# Patient Record
Sex: Male | Born: 1935 | Race: White | Hispanic: No | Marital: Married | Smoking: Former smoker
Health system: Southern US, Community
[De-identification: ages and names within clinical notes are randomized; demographics above are authoritative.]

## PROBLEM LIST (undated history)

## (undated) DIAGNOSIS — Z8711 Personal history of peptic ulcer disease: Secondary | ICD-10-CM

## (undated) DIAGNOSIS — Z8739 Personal history of other diseases of the musculoskeletal system and connective tissue: Secondary | ICD-10-CM

## (undated) DIAGNOSIS — N182 Chronic kidney disease, stage 2 (mild): Secondary | ICD-10-CM

## (undated) DIAGNOSIS — B86 Scabies: Secondary | ICD-10-CM

## (undated) DIAGNOSIS — K5730 Diverticulosis of large intestine without perforation or abscess without bleeding: Secondary | ICD-10-CM

## (undated) DIAGNOSIS — I10 Essential (primary) hypertension: Secondary | ICD-10-CM

## (undated) DIAGNOSIS — Z22322 Carrier or suspected carrier of Methicillin resistant Staphylococcus aureus: Secondary | ICD-10-CM

## (undated) DIAGNOSIS — R609 Edema, unspecified: Principal | ICD-10-CM

## (undated) DIAGNOSIS — E118 Type 2 diabetes mellitus with unspecified complications: Secondary | ICD-10-CM

## (undated) DIAGNOSIS — N400 Enlarged prostate without lower urinary tract symptoms: Secondary | ICD-10-CM

## (undated) DIAGNOSIS — H2102 Hyphema, left eye: Secondary | ICD-10-CM

## (undated) DIAGNOSIS — F0390 Unspecified dementia without behavioral disturbance: Secondary | ICD-10-CM

## (undated) DIAGNOSIS — H409 Unspecified glaucoma: Secondary | ICD-10-CM

## (undated) DIAGNOSIS — H3530 Unspecified macular degeneration: Secondary | ICD-10-CM

## (undated) DIAGNOSIS — M1990 Unspecified osteoarthritis, unspecified site: Secondary | ICD-10-CM

## (undated) DIAGNOSIS — C61 Malignant neoplasm of prostate: Secondary | ICD-10-CM

## (undated) DIAGNOSIS — K449 Diaphragmatic hernia without obstruction or gangrene: Secondary | ICD-10-CM

## (undated) DIAGNOSIS — E785 Hyperlipidemia, unspecified: Secondary | ICD-10-CM

## (undated) DIAGNOSIS — F419 Anxiety disorder, unspecified: Secondary | ICD-10-CM

## (undated) HISTORY — DX: Unspecified dementia without behavioral disturbance: F03.90

## (undated) HISTORY — DX: Chronic kidney disease, stage 2 (mild): N18.2

## (undated) HISTORY — DX: Anxiety disorder, unspecified: F41.9

## (undated) HISTORY — DX: Diaphragmatic hernia without obstruction or gangrene: K44.9

## (undated) HISTORY — DX: Essential (primary) hypertension: I10

## (undated) HISTORY — DX: Unspecified glaucoma: H40.9

## (undated) HISTORY — PX: NASAL SINUS SURGERY: SHX719

## (undated) HISTORY — DX: Malignant neoplasm of prostate: C61

## (undated) HISTORY — DX: Edema, unspecified: R60.9

## (undated) HISTORY — DX: Hyphema, left eye: H21.02

## (undated) HISTORY — DX: Unspecified macular degeneration: H35.30

## (undated) HISTORY — DX: Benign prostatic hyperplasia without lower urinary tract symptoms: N40.0

## (undated) HISTORY — DX: Carrier or suspected carrier of methicillin resistant Staphylococcus aureus: Z22.322

## (undated) HISTORY — DX: Unspecified osteoarthritis, unspecified site: M19.90

## (undated) HISTORY — DX: Hyperlipidemia, unspecified: E78.5

## (undated) HISTORY — DX: Diverticulosis of large intestine without perforation or abscess without bleeding: K57.30

## (undated) HISTORY — PX: OTHER SURGICAL HISTORY: SHX169

## (undated) HISTORY — DX: Personal history of other diseases of the musculoskeletal system and connective tissue: Z87.39

## (undated) HISTORY — DX: Type 2 diabetes mellitus with unspecified complications: E11.8

## (undated) HISTORY — PX: APPENDECTOMY: SHX54

## (undated) HISTORY — PX: TONSILLECTOMY AND ADENOIDECTOMY: SHX28

## (undated) HISTORY — DX: Scabies: B86

## (undated) HISTORY — DX: Personal history of peptic ulcer disease: Z87.11

---

## 2000-12-27 ENCOUNTER — Encounter: Payer: Self-pay | Admitting: Emergency Medicine

## 2000-12-27 ENCOUNTER — Emergency Department (HOSPITAL_COMMUNITY): Admission: EM | Admit: 2000-12-27 | Discharge: 2000-12-27 | Payer: Self-pay | Admitting: Emergency Medicine

## 2003-05-08 HISTORY — PX: CARDIOVASCULAR STRESS TEST: SHX262

## 2003-06-20 ENCOUNTER — Encounter: Admission: RE | Admit: 2003-06-20 | Discharge: 2003-06-20 | Payer: Self-pay | Admitting: Specialist

## 2004-03-22 ENCOUNTER — Ambulatory Visit: Payer: Self-pay | Admitting: Pulmonary Disease

## 2004-05-07 HISTORY — PX: OTHER SURGICAL HISTORY: SHX169

## 2004-12-05 ENCOUNTER — Ambulatory Visit: Admission: RE | Admit: 2004-12-05 | Discharge: 2005-02-01 | Payer: Self-pay | Admitting: Radiation Oncology

## 2004-12-06 ENCOUNTER — Ambulatory Visit (HOSPITAL_COMMUNITY): Admission: RE | Admit: 2004-12-06 | Discharge: 2004-12-06 | Payer: Self-pay | Admitting: Urology

## 2005-02-21 ENCOUNTER — Encounter: Admission: RE | Admit: 2005-02-21 | Discharge: 2005-02-21 | Payer: Self-pay | Admitting: Urology

## 2005-02-28 ENCOUNTER — Ambulatory Visit (HOSPITAL_COMMUNITY): Admission: RE | Admit: 2005-02-28 | Discharge: 2005-02-28 | Payer: Self-pay | Admitting: Urology

## 2005-02-28 ENCOUNTER — Ambulatory Visit: Admission: RE | Admit: 2005-02-28 | Discharge: 2005-04-11 | Payer: Self-pay | Admitting: Radiation Oncology

## 2005-02-28 ENCOUNTER — Ambulatory Visit (HOSPITAL_BASED_OUTPATIENT_CLINIC_OR_DEPARTMENT_OTHER): Admission: RE | Admit: 2005-02-28 | Discharge: 2005-02-28 | Payer: Self-pay | Admitting: Urology

## 2005-05-07 HISTORY — PX: COLONOSCOPY: SHX174

## 2005-06-18 ENCOUNTER — Ambulatory Visit: Payer: Self-pay | Admitting: Pulmonary Disease

## 2005-09-05 ENCOUNTER — Ambulatory Visit: Payer: Self-pay | Admitting: Gastroenterology

## 2005-10-16 ENCOUNTER — Ambulatory Visit: Payer: Self-pay | Admitting: Gastroenterology

## 2006-07-17 ENCOUNTER — Ambulatory Visit: Payer: Self-pay | Admitting: Pulmonary Disease

## 2006-10-21 ENCOUNTER — Ambulatory Visit: Payer: Self-pay | Admitting: Pulmonary Disease

## 2006-10-21 LAB — CONVERTED CEMR LAB
ALT: 10 units/L (ref 0–40)
AST: 15 units/L (ref 0–37)
Albumin: 3.7 g/dL (ref 3.5–5.2)
Alkaline Phosphatase: 57 units/L (ref 39–117)
BUN: 25 mg/dL — ABNORMAL HIGH (ref 6–23)
Basophils Absolute: 0 10*3/uL (ref 0.0–0.1)
Basophils Relative: 0.6 % (ref 0.0–1.0)
Bilirubin, Direct: 0.1 mg/dL (ref 0.0–0.3)
CO2: 31 meq/L (ref 19–32)
Calcium: 9 mg/dL (ref 8.4–10.5)
Chloride: 106 meq/L (ref 96–112)
Cholesterol: 149 mg/dL (ref 0–200)
Creatinine, Ser: 1.2 mg/dL (ref 0.4–1.5)
Eosinophils Absolute: 0.1 10*3/uL (ref 0.0–0.6)
Eosinophils Relative: 2.3 % (ref 0.0–5.0)
GFR calc Af Amer: 76 mL/min
GFR calc non Af Amer: 63 mL/min
Glucose, Bld: 73 mg/dL (ref 70–99)
HCT: 37 % — ABNORMAL LOW (ref 39.0–52.0)
HDL: 43.1 mg/dL (ref >39.0)
Hemoglobin: 13.1 g/dL (ref 13.0–17.0)
Hgb A1c MFr Bld: 6.5 % — ABNORMAL HIGH (ref 4.6–6.0)
LDL Cholesterol: 94 mg/dL (ref 0–99)
Lymphocytes Relative: 28.1 % (ref 12.0–46.0)
MCHC: 35.2 g/dL (ref 30.0–36.0)
MCV: 91.3 fL (ref 78.0–100.0)
Monocytes Absolute: 0.4 10*3/uL (ref 0.2–0.7)
Monocytes Relative: 7.9 % (ref 3.0–11.0)
Neutro Abs: 2.8 10*3/uL (ref 1.4–7.7)
Neutrophils Relative %: 61.1 % (ref 43.0–77.0)
Platelets: 166 10*3/uL (ref 150–400)
Potassium: 4.2 meq/L (ref 3.5–5.1)
RBC: 4.06 M/uL — ABNORMAL LOW (ref 4.22–5.81)
RDW: 12.3 % (ref 11.5–14.6)
Sodium: 142 meq/L (ref 135–145)
TSH: 1.71 microintl units/mL (ref 0.35–5.50)
Total Bilirubin: 0.7 mg/dL (ref 0.3–1.2)
Total CHOL/HDL Ratio: 3.5
Total Protein: 6.8 g/dL (ref 6.0–8.3)
Triglycerides: 59 mg/dL (ref 0–149)
VLDL: 12 mg/dL (ref 0–40)
WBC: 4.6 10*3/uL (ref 4.5–10.5)

## 2007-04-08 ENCOUNTER — Telehealth (INDEPENDENT_AMBULATORY_CARE_PROVIDER_SITE_OTHER): Payer: Self-pay | Admitting: *Deleted

## 2007-04-08 ENCOUNTER — Encounter: Payer: Self-pay | Admitting: Pulmonary Disease

## 2007-04-08 DIAGNOSIS — I10 Essential (primary) hypertension: Secondary | ICD-10-CM | POA: Insufficient documentation

## 2007-07-11 ENCOUNTER — Telehealth (INDEPENDENT_AMBULATORY_CARE_PROVIDER_SITE_OTHER): Payer: Self-pay | Admitting: *Deleted

## 2007-07-23 ENCOUNTER — Telehealth (INDEPENDENT_AMBULATORY_CARE_PROVIDER_SITE_OTHER): Payer: Self-pay | Admitting: *Deleted

## 2007-08-26 ENCOUNTER — Telehealth (INDEPENDENT_AMBULATORY_CARE_PROVIDER_SITE_OTHER): Payer: Self-pay | Admitting: *Deleted

## 2007-09-01 ENCOUNTER — Telehealth (INDEPENDENT_AMBULATORY_CARE_PROVIDER_SITE_OTHER): Payer: Self-pay | Admitting: *Deleted

## 2007-10-23 ENCOUNTER — Ambulatory Visit: Payer: Self-pay | Admitting: Pulmonary Disease

## 2007-10-23 DIAGNOSIS — K449 Diaphragmatic hernia without obstruction or gangrene: Secondary | ICD-10-CM | POA: Insufficient documentation

## 2007-10-23 DIAGNOSIS — E7800 Pure hypercholesterolemia, unspecified: Secondary | ICD-10-CM | POA: Insufficient documentation

## 2007-10-23 DIAGNOSIS — K5730 Diverticulosis of large intestine without perforation or abscess without bleeding: Secondary | ICD-10-CM | POA: Insufficient documentation

## 2007-10-23 DIAGNOSIS — M1990 Unspecified osteoarthritis, unspecified site: Secondary | ICD-10-CM | POA: Insufficient documentation

## 2007-10-23 DIAGNOSIS — M109 Gout, unspecified: Secondary | ICD-10-CM

## 2007-10-23 DIAGNOSIS — C61 Malignant neoplasm of prostate: Secondary | ICD-10-CM

## 2007-10-23 DIAGNOSIS — F411 Generalized anxiety disorder: Secondary | ICD-10-CM | POA: Insufficient documentation

## 2007-10-24 ENCOUNTER — Encounter: Payer: Self-pay | Admitting: Pulmonary Disease

## 2007-10-25 DIAGNOSIS — Z87898 Personal history of other specified conditions: Secondary | ICD-10-CM | POA: Insufficient documentation

## 2007-10-27 LAB — CONVERTED CEMR LAB
ALT: 11 units/L (ref 0–53)
AST: 17 units/L (ref 0–37)
Alkaline Phosphatase: 65 units/L (ref 39–117)
BUN: 19 mg/dL (ref 6–23)
Basophils Absolute: 0 10*3/uL (ref 0.0–0.1)
Basophils Relative: 0.4 % (ref 0.0–1.0)
CO2: 31 meq/L (ref 19–32)
Chloride: 103 meq/L (ref 96–112)
Eosinophils Absolute: 0.1 10*3/uL (ref 0.0–0.7)
Eosinophils Relative: 1.3 % (ref 0.0–5.0)
GFR calc non Af Amer: 63 mL/min
HDL: 40.7 mg/dL (ref >39.0)
LDL Cholesterol: 85 mg/dL (ref 0–99)
Lymphocytes Relative: 19.5 % (ref 12.0–46.0)
MCV: 95.1 fL (ref 78.0–100.0)
Neutrophils Relative %: 73.9 % (ref 43.0–77.0)
Platelets: 163 10*3/uL (ref 150–400)
Potassium: 4.6 meq/L (ref 3.5–5.1)
RBC: 4.1 M/uL — ABNORMAL LOW (ref 4.22–5.81)
TSH: 2.34 microintl units/mL (ref 0.35–5.50)
Total Bilirubin: 0.8 mg/dL (ref 0.3–1.2)
VLDL: 29 mg/dL (ref 0–40)
WBC: 6.8 10*3/uL (ref 4.5–10.5)

## 2008-03-15 ENCOUNTER — Telehealth: Payer: Self-pay | Admitting: Pulmonary Disease

## 2008-04-13 ENCOUNTER — Telehealth (INDEPENDENT_AMBULATORY_CARE_PROVIDER_SITE_OTHER): Payer: Self-pay | Admitting: *Deleted

## 2008-04-22 ENCOUNTER — Encounter: Payer: Self-pay | Admitting: Pulmonary Disease

## 2008-10-05 HISTORY — PX: OTHER SURGICAL HISTORY: SHX169

## 2008-10-14 ENCOUNTER — Telehealth (INDEPENDENT_AMBULATORY_CARE_PROVIDER_SITE_OTHER): Payer: Self-pay | Admitting: *Deleted

## 2008-10-22 ENCOUNTER — Encounter: Payer: Self-pay | Admitting: Pulmonary Disease

## 2008-10-22 ENCOUNTER — Ambulatory Visit: Payer: Self-pay | Admitting: Pulmonary Disease

## 2008-10-22 DIAGNOSIS — J309 Allergic rhinitis, unspecified: Secondary | ICD-10-CM | POA: Insufficient documentation

## 2008-10-22 DIAGNOSIS — H547 Unspecified visual loss: Secondary | ICD-10-CM

## 2008-10-22 LAB — CONVERTED CEMR LAB
AST: 21 units/L (ref 0–37)
BUN: 22 mg/dL (ref 6–23)
Basophils Absolute: 0.1 10*3/uL (ref 0.0–0.1)
Bilirubin, Direct: 0.1 mg/dL (ref 0.0–0.3)
Calcium: 9.4 mg/dL (ref 8.4–10.5)
Cholesterol: 162 mg/dL (ref 0–200)
Creatinine, Ser: 1.2 mg/dL (ref 0.4–1.5)
GFR calc non Af Amer: 62.66 mL/min (ref >60)
Glucose, Bld: 66 mg/dL — ABNORMAL LOW (ref 70–99)
HCT: 40.3 % (ref 39.0–52.0)
HDL: 41.8 mg/dL (ref >39.00)
LDL Cholesterol: 98 mg/dL (ref 0–99)
Lymphocytes Relative: 19.9 % (ref 12.0–46.0)
Lymphs Abs: 1.3 10*3/uL (ref 0.7–4.0)
Monocytes Relative: 6.4 % (ref 3.0–12.0)
Neutrophils Relative %: 70.6 % (ref 43.0–77.0)
Platelets: 191 10*3/uL (ref 150.0–400.0)
RDW: 12.5 % (ref 11.5–14.6)
TSH: 1.36 microintl units/mL (ref 0.35–5.50)
Total Bilirubin: 1 mg/dL (ref 0.3–1.2)
VLDL: 22.6 mg/dL (ref 0.0–40.0)

## 2008-10-28 ENCOUNTER — Telehealth (INDEPENDENT_AMBULATORY_CARE_PROVIDER_SITE_OTHER): Payer: Self-pay | Admitting: *Deleted

## 2008-11-12 ENCOUNTER — Telehealth (INDEPENDENT_AMBULATORY_CARE_PROVIDER_SITE_OTHER): Payer: Self-pay | Admitting: *Deleted

## 2008-11-22 LAB — CONVERTED CEMR LAB: Vit D, 25-Hydroxy: 38 ng/mL (ref 30–89)

## 2009-01-23 ENCOUNTER — Encounter: Payer: Self-pay | Admitting: Pulmonary Disease

## 2009-05-07 HISTORY — PX: SKIN CANCER EXCISION: SHX779

## 2009-05-10 ENCOUNTER — Encounter: Payer: Self-pay | Admitting: Pulmonary Disease

## 2009-06-30 ENCOUNTER — Telehealth: Payer: Self-pay | Admitting: Pulmonary Disease

## 2009-10-24 ENCOUNTER — Telehealth (INDEPENDENT_AMBULATORY_CARE_PROVIDER_SITE_OTHER): Payer: Self-pay | Admitting: *Deleted

## 2009-10-27 ENCOUNTER — Ambulatory Visit: Payer: Self-pay | Admitting: Pulmonary Disease

## 2009-11-04 LAB — CONVERTED CEMR LAB
ALT: 10 units/L (ref 0–53)
AST: 16 units/L (ref 0–37)
Albumin: 4 g/dL (ref 3.5–5.2)
CO2: 31 meq/L (ref 19–32)
Calcium: 9 mg/dL (ref 8.4–10.5)
Eosinophils Relative: 3.9 % (ref 0.0–5.0)
GFR calc non Af Amer: 91.78 mL/min (ref >60)
HDL: 39.6 mg/dL (ref >39.00)
Hgb A1c MFr Bld: 7.6 % — ABNORMAL HIGH (ref 4.6–6.5)
Monocytes Relative: 9 % (ref 3.0–12.0)
Neutrophils Relative %: 59 % (ref 43.0–77.0)
Platelets: 189 10*3/uL (ref 150.0–400.0)
Sodium: 138 meq/L (ref 135–145)
TSH: 2.61 microintl units/mL (ref 0.35–5.50)
Total CHOL/HDL Ratio: 4
Triglycerides: 162 mg/dL — ABNORMAL HIGH (ref 0.0–149.0)
Uric Acid, Serum: 4.7 mg/dL (ref 4.0–7.8)
WBC: 5.8 10*3/uL (ref 4.5–10.5)

## 2010-02-24 ENCOUNTER — Encounter: Payer: Self-pay | Admitting: Pulmonary Disease

## 2010-02-28 ENCOUNTER — Encounter: Payer: Self-pay | Admitting: Pulmonary Disease

## 2010-05-05 ENCOUNTER — Telehealth (INDEPENDENT_AMBULATORY_CARE_PROVIDER_SITE_OTHER): Payer: Self-pay | Admitting: *Deleted

## 2010-06-06 NOTE — Assessment & Plan Note (Signed)
Summary: cpx/fasting/apc   CC:  Yearly ROV & review of mult medical problems....  History of Present Illness: 77 y/o WM here for a yearly check up and refill of meds...    ~  [place]  [place], [place] the [place] area soon... he noted decr vision in his left eye recently- [facility] by [facility]- sent to DrEpes for cataract surg done yest 10/21/08... notes persistant decr vision in left eye and due for f/u w/ poss eval by DrMatthews to check retina...   ~  October 27, 2009:  he has had a good year x c/o vision (see below)... he exercises doing yard work & denies CP, palpit, ch in SOB, edema, etc... BP controlled on meds;  Chol OK on the Simva20;  last yr DM med changed to plain Metformin but A1c is 7.6 now & he needs to incr to Bid...   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  ALLERGIC RHINITIS (ICD-477.9) - takes CLARITIN daily & c/o runny nose...  HYPERTENSION (ICD-401.9) - on ASA 81mg /d, DIOVAN 80mg /d & LOZOL 2.5mg /d... BP today = 110/62, and he reports BP's all  ~120/80 at home... feeling well and tolerates meds well... denies HA, fatigue, visual changes, CP, palipit, dizziness, syncope, dyspnea, edema, etc... he mows regularly and walks...   ~  NuclearStressTest 3/05 showed scarring at the base, no ischemia, EF=60%...  ~  6/09: BP was sl low & we stopped Lozol, but he had to restart due to BP elevation off this med.  ~  6/10: CXR- clear; Labs- all WNL.Marland Kitchen. continue same Rx & monitor at home.  HYPERCHOLESTEROLEMIA (ICD-272.0) - on SIMVASTATIN 20mg /d...  ~  FLP 6/08 showed TChol 149, TG 59, HDL 43, LDL 94  ~  FLP 6/09 showed TChol 155, TG 145, HDL 41, LDL 85  ~  FLP 6/10 showed TChol 162, TG 113, HDL 42, LDL 98  ~  FLP 6/11 showed TChol 146, TG 162, HDL 40,  LDL 74  DM (ICD-250.00) - prev on Glucovance 2.5-500 one tab in AM & switched to Metformin 500Qam in 2010... weight is maintained in the 160# range on diet... BS at home are "all good" per pt although he didn't bring list or recall the numbers...  ~  labs 6/08 showed BS= 73, HgA1c= 6.5  ~  labs 6/09 showed BS= 90, A1c= 6.4  ~  labs 6/10 showed BS= 66, A1c= 6.3.Marland KitchenMarland Kitchen rec change to Metformin 500/d alone.  ~  labs 6/11 showed BS= 137, A1c= 7.6.Marland KitchenMarland Kitchen rec to incr the METFORMIN 500mg Bid.  HIATAL HERNIA (ICD-553.3) - off Nexium since he stopped the Indocin Rx... EGD was performed in 1997 showing mild duodenitis only... he has a hx of treated HPylori in 1990 and recurrent PUD in the past...  DIVERTICULOSIS OF COLON (ICD-562.10) - last colonoscopy 6/07 by DrPatterson showed divertics only... prev colon was in 2000 showing divertics, int hems, no polyps...  Hx of CARCINOMA, PROSTATE (ICD-185) - eval and Rx by DrGrapey in 2006 w/ radium seed implantation by DrMurray... BENIGN PROSTATIC HYPERTROPHY, HX OF (ICD-V13.8) - hx BPH w/ BNC per DrGrapey... not on meds  ~  labs 6/09 showed PSA= 0.32  ~  labs 6/10: PSA not done- followed by DrGrapey yearly.  ~  labs 6/11 showed PSA = 0.07  DEGENERATIVE JOINT DISEASE (ICD-715.90) - hx prev left shoulder surg & c/o left shoulder pain after pressure washing... Hx of GOUT (ICD-274.9) - on ALLOPURINOL 300mg /d & off INDOCIN now...  ~  labs 6/09 showed Uric= 3.6  ~  labs 6/10 showed Uric= 4.5  ~  labs 6/11 showed Uric = 4.7  ANXIETY (ICD-300.00) - uses ALPRAZOLAM 0.5mg - 1/2 to 1 tab Tid Prn...  GENERAL HEALTH:  he is an ex-smoker having quit over 46yrs ago...    ***he had Pneumovax in 2001, & Tetanus shot in 2002***   Allergies: 1)  ! Atenolol (Atenolol) 2)  Codeine Phosphate (Codeine Phosphate)  Comments:  Nurse/Medical Assistant: The patient's medications and allergies were reviewed with the patient and were updated in the Medication and Allergy Lists.  Past  History:  Past Medical History: UNSPECIFIED VISUAL LOSS (ICD-369.9) ALLERGIC RHINITIS (ICD-477.9) HYPERTENSION (ICD-401.9) HYPERCHOLESTEROLEMIA (ICD-272.0) DM (ICD-250.00) HIATAL HERNIA (ICD-553.3) DIVERTICULOSIS OF COLON (ICD-562.10) BENIGN PROSTATIC HYPERTROPHY, HX OF (ICD-V13.8) Hx of CARCINOMA, PROSTATE (ICD-185) DEGENERATIVE JOINT DISEASE (ICD-715.90) Hx of GOUT (ICD-274.9) ANXIETY (ICD-300.00)  Past Surgical History: adenocarcinoma of the prostate w/ seed implatation in 2006 Appendectomy Tonsillectomy Right knee  Surgery Left shoulder surgery S/P left cataract surg 6/10 by DrEpes  Family History: Reviewed history and no changes required.  Social History: Reviewed history and no changes required.  Review of Systems      See HPI       The patient complains of dyspnea on exertion, joint pain, stiffness, anxiety, and hay fever.  The patient denies fever, chills, sweats, anorexia, fatigue, weakness, malaise, weight loss, sleep disorder, blurring, diplopia, eye irritation, eye discharge, vision loss, eye pain, photophobia, earache, ear discharge, tinnitus, decreased hearing, nasal congestion, nosebleeds, sore throat, hoarseness, chest pain, palpitations, syncope, orthopnea, PND, peripheral edema, cough, dyspnea at rest, excessive sputum, hemoptysis, wheezing, pleurisy, nausea, vomiting, diarrhea, constipation, change in bowel habits, abdominal pain, melena, hematochezia, jaundice, gas/bloating, indigestion/heartburn, dysphagia, odynophagia, dysuria, hematuria, urinary frequency, urinary hesitancy, nocturia, incontinence, back pain, joint swelling, muscle cramps, muscle weakness, arthritis, sciatica, restless legs, leg pain at night, leg pain with exertion, rash, itching, dryness, suspicious lesions, paralysis, paresthesias, seizures, tremors, vertigo, transient blindness, frequent falls, frequent headaches, difficulty walking, depression, memory loss, confusion, cold intolerance,  heat intolerance, polydipsia, polyphagia, polyuria, unusual weight change, abnormal bruising, bleeding, enlarged lymph nodes, urticaria, allergic rash, and recurrent infections.    Vital Signs:  Patient profile:   77 year old male Height:      70 inches Weight:      164 pounds BMI:     23.62 O2 Sat:      97 % on Room air Temp:     97.0 degrees F oral Pulse rate:   73 / minute BP sitting:   110 / 62  (left arm) Cuff size:   regular  Vitals Entered By: Randell Loop CMA (October 27, 2009 8:45 AM)  O2 Sat at Rest %:  97 O2 Flow:  Room air CC: Yearly ROV & review of mult medical problems... Is Patient Diabetic? Yes Pain Assessment Patient in pain? no      Comments pt brought all meds in today   Physical Exam  Additional Exam:  WD, WN, 77 y/o WM in NAD... GENERAL:  Alert & oriented; pleasant & cooperative... HEENT:  [place]/AT, EOM-wnl, PERRLA, EACs-clear, TMs-wnl, NOSE-clear, THROAT-clear & wnl. NECK:  Supple w/ fairROM; no JVD; normal carotid impulses w/o bruits; no thyromegaly or nodules palpated; no lymphadenopathy. CHEST:  Clear to P & A; without wheezes/ rales/ or rhonchi. HEART:  Regular Rhythm; without murmurs/ rubs/ or gallops. ABDOMEN:  Soft & nontender; normal bowel sounds; no organomegaly or masses detected. RECTAL:  sl atrophic testes, no lesions, no hernia felt; rectal: palp prostate w/o nodule, stool brn heme neg. EXT: without deformities, mild arthritic changes; no varicose veins/ venous insuffic/ or edema. decr ROM left shoulder... NEURO:  CN's intact;  no focal neuro deficits... DERM:  No lesions noted; no rash etc...    MISC. Report  Procedure date:  10/27/2009  Findings:      BMP (METABOL)   Sodium                    138 mEq/L                   135-145   Potassium                 3.7 mEq/L                   3.5-5.1   Chloride                  99 mEq/L                    96-112   Carbon Dioxide            31 mEq/L                    19-32   Glucose               [H]  137 mg/dL                   62-13   BUN                  [H]  24 mg/dL                    0-86   Creatinine                0.9 mg/dL                   5.7-8.4   Calcium                   9.0 mg/dL                   6.9-62.9   GFR                       91.78 mL/min                >60  Lipid Panel (LIPID)   Cholesterol               146 mg/dL                   5-284     ATP III Classification   Triglycerides        [H]  162.0 mg/dL                 1.3-244.0   HDL                       10.27 mg/dL                 >25.36  LDL Cholesterol           74 mg/dL                    1-19  CHO/HDL Ratio:  CHD Risk   Hepatic/Liver Function Panel (HEPATIC)   Total Bilirubin           0.4 mg/dL                   1.4-7.8   Direct Bilirubin          0.1 mg/dL                   2.9-5.6   Alkaline Phosphatase      64 U/L                      39-117   AST                       16 U/L                      0-37   ALT                       10 U/L                      0-53   Total Protein             6.9 g/dL                    2.1-3.0   Albumin                   4.0 g/dL                    8.6-5.7  Comments:      CBC Platelet w/Diff (CBCD)   White Cell Count          5.8 K/uL                    4.5-10.5   Red Cell Count       [L]  4.12 Mil/uL                 4.22-5.81   Hemoglobin                13.2 g/dL                   84.6-96.2   Hematocrit           [L]  38.9 %                      39.0-52.0   MCV                       94.4 fl                     78.0-100.0   Platelet Count            189.0 K/uL                  150.0-400.0   Neutrophil %              59.0 %  43.0-77.0   Lymphocyte %              27.7 %                      12.0-46.0   Monocyte %                9.0 %                       3.0-12.0   Eosinophils%              3.9 %                       0.0-5.0   Basophils %               0.4 %                       0.0-3.0  TSH (TSH)   FastTSH                   2.61  uIU/mL                 0.35-5.50   Uric Acid (URIC)   Uric Acid                 4.7 mg/dL                   7.8-4.6  Prostate Specific Antigen (PSA)   PSA-Hyb              [L]  0.07 ng/mL                  0.10-4.00  Hemoglobin A1C (A1C)   Hemoglobin A1C       [H]  7.6 %                       4.6-6.5   Impression & Recommendations:  Problem # 1:  HYPERTENSION (ICD-401.9) Controlled on meds>  continue same. His updated medication list for this problem includes:    Diovan 80 Mg Tabs (Valsartan) .Marland Kitchen... Take 1 tablet by mouth once a day    Indapamide 2.5 Mg Tabs (Indapamide) .Marland Kitchen... Take 1 tablet by mouth once a day  Problem # 2:  HYPERCHOLESTEROLEMIA (ICD-272.0) Stable on diet w/ Simva20... continue same. His updated medication list for this problem includes:    Simvastatin 20 Mg Tabs (Simvastatin) .Marland Kitchen... Take 1 tablet by mouth once a day  Problem # 3:  DM (ICD-250.00) He was switched to Metformin 500mg  Qam last yr & A1c now 7.6.Marland KitchenMarland Kitchen REC> increase to 500mg  Bid + low carb diet... His updated medication list for this problem includes:    Aspirin Ec 81 Mg Tbec (Aspirin) .Marland Kitchen... Take 1 tablet by mouth once a day    Diovan 80 Mg Tabs (Valsartan) .Marland Kitchen... Take 1 tablet by mouth once a day    Metformin Hcl 500 Mg Tabs (Metformin hcl) .Marland Kitchen... Take 1 tab by mouth each morning  Problem # 4:  GI>>> Hx HH/ GERD/ Divertics... off nexium w/ resolution of symptoms, last colon 2007 & up to date...  Problem # 5:  Hx of CARCINOMA, PROSTATE (ICD-185) followed by DrGrapey & PSA - 0.07... copy to Urology.  Problem # 6:  DEGENERATIVE JOINT DISEASE (ICD-715.90) Left shoulder discomfort as noted>  uses OTC meds Prn & f/u Ortho Prn.Marland KitchenMarland Kitchen  Also has hx Gout & Uric= 4.7 on the Allopurinol, daily... His updated medication list for this problem includes:    Aspirin Ec 81 Mg Tbec (Aspirin) .Marland Kitchen... Take 1 tablet by mouth once a day  Problem # 7:  ANXIETY (ICD-300.00) Stable on the Prn Alprazolam Rx... His updated  medication list for this problem includes:    Alprazolam 0.5 Mg Tabs (Alprazolam) .Marland Kitchen... Take 1/2-1 tablet by mouth three times a day as needed for nerves...  Complete Medication List: 1)  Claritin 10 Mg Tabs (Loratadine) .... Take one tablet by mouth at bedtime 2)  Aspirin Ec 81 Mg Tbec (Aspirin) .... Take 1 tablet by mouth once a day 3)  Diovan 80 Mg Tabs (Valsartan) .... Take 1 tablet by mouth once a day 4)  Indapamide 2.5 Mg Tabs (Indapamide) .... Take 1 tablet by mouth once a day 5)  Simvastatin 20 Mg Tabs (Simvastatin) .... Take 1 tablet by mouth once a day 6)  Metformin Hcl 500 Mg Tabs (Metformin hcl) .... Take 1 tab by mouth each morning 7)  Allopurinol 300 Mg Tabs (Allopurinol) .... Take 1 tablet by mouth once a day 8)  Alprazolam 0.5 Mg Tabs (Alprazolam) .... Take 1/2-1 tablet by mouth three times a day as needed for nerves.Marland KitchenMarland Kitchen 9)  Preservision/lutein Caps (Multiple vitamins-minerals) .... Take 1 tablet by mouth once a day 10)  Systane Ultra 0.4-0.3 % Soln (Polyethyl glycol-propyl glycol) .... Use 1 drop two times a day in left eye  Patient Instructions: 1)  Today we updated your med list- see below.... 2)  We refilled your perscriptions per request... 3)  Today we did your follow up FASTING blood work... please call the "phone tree" in a few days for your lab results.Marland KitchenMarland Kitchen 4)  We will send copies to DrGrapey for his records... 5)  Call for any questions.Marland KitchenMarland Kitchen 6)  Please schedule a follow-up appointment in 1 year, sooner as needed. Prescriptions: ALPRAZOLAM 0.5 MG TABS (ALPRAZOLAM) Take 1/2-1 tablet by mouth three times a day as needed for nerves...  #90 x 11   Entered and Authorized by:   Michele Mcalpine MD   Signed by:   Michele Mcalpine MD on 10/27/2009   Method used:   Print then Give to Patient   RxID:   [id number] ALLOPURINOL 300 MG TABS (ALLOPURINOL) Take 1 tablet by mouth once a day  #30 x 11   Entered and Authorized by:   Michele Mcalpine MD   Signed by:   Michele Mcalpine MD on  10/27/2009   Method used:   Print then Give to Patient   RxID:   [id number] SIMVASTATIN 20 MG TABS (SIMVASTATIN) Take 1 tablet by mouth once a day  #30 x 11   Entered and Authorized by:   Michele Mcalpine MD   Signed by:   Michele Mcalpine MD on 10/27/2009   Method used:   Print then Give to Patient   RxID:   [id number] METFORMIN HCL 500 MG TABS (METFORMIN HCL) take 1 tab by mouth each morning  #30 x 11   Entered and Authorized by:   Michele Mcalpine MD   Signed by:   Michele Mcalpine MD on 10/27/2009   Method used:   Print then Give to Patient   RxID:   [id number] INDAPAMIDE 2.5 MG TABS (INDAPAMIDE) Take 1 tablet by mouth once a day  #30 x 11   Entered and Authorized by:   Michele Mcalpine MD  Signed by:   Michele Mcalpine MD on 10/27/2009   Method used:   Print then Give to Patient   RxID:   [id number] DIOVAN 80 MG TABS (VALSARTAN) Take 1 tablet by mouth once a day  #30 x 11   Entered and Authorized by:   Michele Mcalpine MD   Signed by:   Michele Mcalpine MD on 10/27/2009   Method used:   Print then Give to Patient   RxID:   [id number]

## 2010-06-06 NOTE — Miscellaneous (Signed)
[facility]   Imported By: Lennie Odor 03/02/2010 11:45:07  _____________________________________________________________________  External Attachment:    Type:   Image     Comment:   External Document

## 2010-06-06 NOTE — Progress Notes (Signed)
Summary: lab appt  Phone Note Call from Patient Call back at [phone]   Caller: Patient Call For: nadel Reason for Call: Talk to Nurse Summary of Call: Pt has a cpx sch on 6/23, wants to know if labs can be sch for around 7:30a on this day. Initial call taken by: Darletta Moll,  October 24, 2009 10:20 AM  Follow-up for Phone Call        Will forward message to SN-pls advise what labs this pt needs.  Thanks! Gweneth Dimitri RN  October 24, 2009 10:51 AM  TOLD PT'S DAUGHTER IT WAS OK FOR HIM TO COME IN WITH THE WIFE PLS PUT LABS IN IDX FOR 6/23  Follow-up by: Philipp Deputy CMA,  October 24, 2009 4:21 PM  Additional Follow-up for Phone Call Additional follow up Details #1::        PER SN FLP-272.0,BMP-401.9,HEPATIC-790.5,CBCD-785.9,TSH-244.9,PSA-V76.44, URIC ACID-274.9, A1C-250.00 Additional Follow-up by: Philipp Deputy CMA,  October 25, 2009 8:07 AM

## 2010-06-06 NOTE — Letter (Signed)
[facility]: [facility]   Imported By: Sherian [name] 05/18/2009 14:47:38  _____________________________________________________________________  External Attachment:    Type:   Image     Comment:   External Document

## 2010-06-06 NOTE — Progress Notes (Signed)
Summary: rx  Phone Note Call from Patient Call back at [phone]   Caller: Spouse-[name] Call For: Randy Cohen Reason for Call: Refill Medication Summary of Call: Need Diavan 80mg  [facility] [place] [phone] Initial call taken by: Eugene Gavia,  June 30, 2009 2:45 PM    Prescriptions: DIOVAN 80 MG TABS (VALSARTAN) Take 1 tablet by mouth once a day  #30 x 4   Entered by:   Randell Loop CMA   Authorized by:   Michele Mcalpine MD   Signed by:   Randell Loop CMA on 06/30/2009   Method used:   Telephoned to ...       [facility]  [place] [address] (retail)       [address]       [place], [place]  [address]       Ph: [phone] or [phone]       Fax: [phone]   RxID:   [id number]  [facility] [place] [facility] now.  called and spoke with pts wife and she is aware Randell Loop CMA  June 30, 2009 3:02 PM

## 2010-06-06 NOTE — Miscellaneous (Signed)
[facility]   Clinical Lists Changes  Observations: Added new observation of FLU VAX: Historical (02/24/2010 8:46)      Immunization History:  Influenza Immunization History:    Influenza:  historical (02/24/2010) 

## 2010-06-08 NOTE — Progress Notes (Signed)
Summary: Refill alprazolam  Phone Note Call from Patient   Caller: Spouse [name] Reason for Call: Refill Medication Summary of Call: [facility] [place] [phone] and authorize refill of alprazolam 0.5 mg. Initial call taken by: Leonette Monarch,  May 05, 2010 10:57 AM  Follow-up for Phone Call        per Marliss Czar this was done at 10:30 this morning, see refill document Follow-up by: Philipp Deputy CMA,  May 05, 2010 11:15 AM

## 2010-06-13 ENCOUNTER — Encounter: Payer: Self-pay | Admitting: Pulmonary Disease

## 2010-07-04 NOTE — Letter (Signed)
Summary: [facility]   Imported By: Sherian [name] 06/27/2010 12:48:10  _____________________________________________________________________  External Attachment:    Type:   Image     Comment:   External Document

## 2010-08-24 ENCOUNTER — Other Ambulatory Visit: Payer: Self-pay | Admitting: Pulmonary Disease

## 2010-09-22 NOTE — Op Note (Signed)
NAME:  Hogans, LEAMAN ABE NO.:  [id number]   MEDICAL RECORD NO.:  [id number]          PATIENT TYPE:  AMB   LOCATION:  [facility]                         FACILITY:  [facility]   PHYSICIAN:  Valetta Fuller, M.D.  DATE OF BIRTH:  Jun 22, 1931   DATE OF PROCEDURE:  DATE OF DISCHARGE:                                 OPERATIVE REPORT   PREOPERATIVE DIAGNOSIS:  Clinical stage T1C adenocarcinoma of the prostate.   POSTOPERATIVE DIAGNOSIS:  Clinical stage T1C adenocarcinoma of the prostate.   PROCEDURE PERFORMED:  I125 interstitial prostatic seed implantation with  flexible cystoscopy.   SURGEON:  Valetta Fuller, M.D.   ASSISTANT:  Maryln Gottron, M.D.   ANESTHESIA:  General.   INDICATIONS:  Mr. Randy Cohen is a 77 year old male. He was sent to see me because  of PSA that had increased from approximately 4 up to 6. He underwent  ultrasound and biopsy. Ultrasound revealed approximately a 50 gram prostate.  The patient was diagnosed with what appeared to be low volume, relatively  well-differentiated adenocarcinoma of the prostate with a Gleason score of  3+3=6 involving approximately 25% of the biopsy material on the left side.  The patient did have some additional imaging studies at the family request  which failed to reveal metastatic disease. We felt that given his age over  48 and what appeared to be relatively low volume, well-differentiated  disease that radical retropubic prostatectomy would be over aggressive  therapy. We discussed with him the possibility of observation and watchful  waiting, but the family and the patient wanted to go ahead with attempted  definitive therapy. They appeared to understand the advantages and  disadvantages of a radiation approach. The patient and family were seen and  consulted by Dr. Ballard Russell. He agreed that he was a reasonable candidate  for mono therapy with interstitial I125 seed implantation. We decided  because of the size of his  prostate to go ahead and initiate Avodart which  he has been on for approximately 3 months to try to get a little bit of  prostatic shrinkage. The patient is doing well and has had no voiding  complaints or issues. He now presents for his seed implantation.   TECHNIQUE AND FINDINGS:  The patient was brought to the operating room where  he had successful induction of general anesthesia. He was then placed in  lithotomy position and prepped and draped in the usual manner. The  transrectal ultrasound probe with anchoring stand was then placed and real  time imaging of the prostate was obtained. The ultrasound unit was utilized  in both transverse as well as sagittal plane to obtain a real time  contouring of the prostate, urethra, and rectum, which was done by Dr.  Dayton Scrape. Volumetrics at that point suggested a prostate in the 35-40 grams  range. Once all the real time contouring was done, anchoring needles had  also been placed into the prostate through the perineal grid system. Of  note, a Foley catheter had also been placed for help with visualization of  the bladder neck. At  that point, the real time plan was constructed. This  was reviewed by myself and Dr. Dayton Scrape and we were quite pleased with the  dosing prescribed to the target the prostate as well as the dosing results  for the urethra and rectum. Utilizing real time ultrasonography, each needle  was passed and the Nucletron robotic arm was used to deliver the seeds. A  total of 25 needles were utilized with a total number of seeds of 50. The  total activity was 32.5 mCi. Reference dosing can be obtained through the  radiation oncology report. At the completion of the procedure, fluoro unit  was brought in to assess seed distribution. This seemed to be extremely  good and correlated very nicely with the preplan. Cystoscopy was then  performed after removal of the catheter and no seeds were seen in the  urethra or bladder. A new Foley  catheter was then placed and attached to  gravity drainage. The patient appeared to tolerate the procedure well and  there were no obvious complications.           ______________________________  Valetta Fuller, M.D.     DSG/MEDQ  D:  02/28/2005  T:  02/28/2005  Job:  [id number]   cc:   Maryln Gottron, M.D.  Fax: [phone]   Lonzo Cloud. Kriste Basque, M.D. [facility]  [address]  [place]  [place] [address]

## 2010-09-23 ENCOUNTER — Other Ambulatory Visit: Payer: Self-pay | Admitting: Pulmonary Disease

## 2010-10-05 ENCOUNTER — Other Ambulatory Visit: Payer: Self-pay | Admitting: *Deleted

## 2010-10-05 MED ORDER — ALPRAZOLAM 0.5 MG PO TABS: ORAL_TABLET | ORAL | Status: DC

## 2010-10-05 NOTE — Telephone Encounter (Signed)
Refill request received for refills of alprazolam 0.5---faxed back to the pharmacy for #90 with no refills since pt has appt on June 28

## 2010-10-24 ENCOUNTER — Telehealth: Payer: Self-pay | Admitting: Pulmonary Disease

## 2010-10-24 MED ORDER — ALPRAZOLAM 0.5 MG PO TABS: ORAL_TABLET | ORAL | Status: DC

## 2010-10-24 NOTE — Telephone Encounter (Signed)
Rx called to pharm and spoke with pt's spouse and notified this was done, and advised her to tell pt to keep appt for additional refills. She verbalized understanding.

## 2010-10-24 NOTE — Telephone Encounter (Signed)
Pt's wife called again states the pharmacy still doesn't have pt's prescription.Randy Cohen

## 2010-10-24 NOTE — Telephone Encounter (Signed)
Dr Kriste Basque, pls advise if okay to refill alprazolam. Pt last seen here on 10/27/09 and has ov with you pending for 11/02/10.

## 2010-10-24 NOTE — Telephone Encounter (Signed)
SPOUSE CALLED AGAIN RE: SAME. WANTS TO MAKE SURE THAT THIS IS CALLED IN TODAY OR PT WILL NOT BE ABLE TO SLEEP AT ALL. Randy Cohen

## 2010-10-24 NOTE — Telephone Encounter (Signed)
Yes please fill the alprazolam #30 and we can give rx at his appt. thanks

## 2010-11-01 ENCOUNTER — Encounter: Payer: Self-pay | Admitting: Pulmonary Disease

## 2010-11-02 ENCOUNTER — Ambulatory Visit (INDEPENDENT_AMBULATORY_CARE_PROVIDER_SITE_OTHER): Payer: Medicare Other | Admitting: Pulmonary Disease

## 2010-11-02 ENCOUNTER — Encounter: Payer: Self-pay | Admitting: Pulmonary Disease

## 2010-11-02 ENCOUNTER — Other Ambulatory Visit (INDEPENDENT_AMBULATORY_CARE_PROVIDER_SITE_OTHER): Payer: Medicare Other

## 2010-11-02 VITALS — BP 116/70 | HR 57 | Temp 96.8°F | Ht 70.0 in | Wt 164.2 lb

## 2010-11-02 DIAGNOSIS — Z0000 Encounter for general adult medical examination without abnormal findings: Secondary | ICD-10-CM

## 2010-11-02 DIAGNOSIS — Z87898 Personal history of other specified conditions: Secondary | ICD-10-CM

## 2010-11-02 DIAGNOSIS — M1990 Unspecified osteoarthritis, unspecified site: Secondary | ICD-10-CM

## 2010-11-02 DIAGNOSIS — F411 Generalized anxiety disorder: Secondary | ICD-10-CM

## 2010-11-02 DIAGNOSIS — I10 Essential (primary) hypertension: Secondary | ICD-10-CM

## 2010-11-02 DIAGNOSIS — E7800 Pure hypercholesterolemia, unspecified: Secondary | ICD-10-CM

## 2010-11-02 DIAGNOSIS — E119 Type 2 diabetes mellitus without complications: Secondary | ICD-10-CM

## 2010-11-02 DIAGNOSIS — M109 Gout, unspecified: Secondary | ICD-10-CM

## 2010-11-02 DIAGNOSIS — K449 Diaphragmatic hernia without obstruction or gangrene: Secondary | ICD-10-CM

## 2010-11-02 DIAGNOSIS — K5730 Diverticulosis of large intestine without perforation or abscess without bleeding: Secondary | ICD-10-CM

## 2010-11-02 DIAGNOSIS — C61 Malignant neoplasm of prostate: Secondary | ICD-10-CM

## 2010-11-02 LAB — CBC WITH DIFFERENTIAL/PLATELET
Basophils Absolute: 0 10*3/uL (ref 0.0–0.1)
Eosinophils Absolute: 0.1 10*3/uL (ref 0.0–0.7)
Hemoglobin: 13.5 g/dL (ref 13.0–17.0)
Lymphocytes Relative: 29.1 % (ref 12.0–46.0)
MCHC: 34 g/dL (ref 30.0–36.0)
Neutro Abs: 4 10*3/uL (ref 1.4–7.7)
RDW: 13.5 % (ref 11.5–14.6)

## 2010-11-02 LAB — BASIC METABOLIC PANEL
BUN: 29 mg/dL — ABNORMAL HIGH (ref 6–23)
CO2: 31 mEq/L (ref 19–32)
GFR: 71.91 mL/min (ref >60.00)
Glucose, Bld: 119 mg/dL — ABNORMAL HIGH (ref 70–99)
Potassium: 4.2 mEq/L (ref 3.5–5.1)

## 2010-11-02 LAB — LIPID PANEL
Cholesterol: 174 mg/dL (ref 0–200)
VLDL: 34.4 mg/dL (ref 0.0–40.0)

## 2010-11-02 LAB — HEPATIC FUNCTION PANEL
ALT: 15 U/L (ref 0–53)
AST: 17 U/L (ref 0–37)
Albumin: 4.1 g/dL (ref 3.5–5.2)
Total Protein: 6.8 g/dL (ref 6.0–8.3)

## 2010-11-02 MED ORDER — INDAPAMIDE 2.5 MG PO TABS: 2.5000 mg | ORAL_TABLET | Freq: Every day | ORAL | Status: DC

## 2010-11-02 MED ORDER — METFORMIN HCL 500 MG PO TABS: 500.0000 mg | ORAL_TABLET | Freq: Two times a day (BID) | ORAL | Status: DC

## 2010-11-02 MED ORDER — TETANUS-DIPHTH-ACELL PERTUSSIS 5-2.5-18.5 LF-MCG/0.5 IM SUSP
0.5000 mL | Freq: Once | INTRAMUSCULAR | Status: AC
  Administered 2010-11-02: 0.5 mL via INTRAMUSCULAR

## 2010-11-02 MED ORDER — SIMVASTATIN 20 MG PO TABS: 20.0000 mg | ORAL_TABLET | Freq: Every day | ORAL | Status: DC

## 2010-11-02 MED ORDER — ALLOPURINOL 300 MG PO TABS: 300.0000 mg | ORAL_TABLET | Freq: Every day | ORAL | Status: DC

## 2010-11-02 MED ORDER — ALPRAZOLAM 0.5 MG PO TABS: ORAL_TABLET | ORAL | Status: DC

## 2010-11-02 MED ORDER — VALSARTAN 80 MG PO TABS: 80.0000 mg | ORAL_TABLET | Freq: Every day | ORAL | Status: DC

## 2010-11-02 NOTE — Progress Notes (Signed)
Subjective:    Patient ID: Randy Cohen, male    DOB: 1931/10/01, 77 y.o.   MRN: [id number]  HPI 77 y/o WM here for a yearly check up and refill of meds...   ~  [place]  [place], [place] looking [place] move [place] to the [place] area soon... he noted decr vision in his left eye recently- seen by [facility]- sent to [facility] for cataract surg done yest 10/21/08... notes persistant decr vision in left eye and due for f/u w/ poss eval by Randy [facility] to check retina...  ~  October 27, 2009:  he has had a good year x c/o vision (see below)... he exercises doing yard work & denies CP, palpit, ch in SOB, edema, etc... BP controlled on meds;  Chol OK on the Simva20;  last yr DM med changed to plain Metformin but A1c is 7.6 now & he needs to incr to Bid...  ~  November 02, 2010:  1 Year ROV & [facility] appears to be doing well, stable, no new complaints or concerns other than some mild hip discomfort that he treats w/ OTC anlagesics prn (he didn't get to talk much as his wife kept interrupting w/ issues of her own)...     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  ALLERGIC RHINITIS (ICD-477.9) - takes CLARITIN daily & c/o runny nose...  HYPERTENSION (ICD-401.9) - on ASA 325mg /d, DIOVAN 80mg /d & LOZOL 2.5mg /d... BP today = 116/70, and he reports BP's all ~120/80 at home... feeling well and tolerates meds well... denies HA, fatigue, visual changes, CP, palipit,  dizziness, syncope, dyspnea, edema, etc... he mows regularly and walks...  ~  NuclearStressTest 3/05 showed scarring at the base, no ischemia, EF=60%... ~  6/09: BP was sl low & we stopped Lozol, but he had to restart due to BP elevation off this med. ~  6/10: CXR- clear; Labs- all WNL.Marland Kitchen. continue same Rx & monitor at home.  HYPERCHOLESTEROLEMIA (ICD-272.0) - on SIMVASTATIN 20mg /d... ~  FLP 6/08 showed TChol 149, TG 59, HDL 43, LDL 94 ~  FLP 6/09 showed TChol 155, TG 145, HDL 41, LDL 85 ~  FLP 6/10 showed TChol 162, TG 113, HDL 42, LDL 98 ~  FLP 6/11 showed TChol 146, TG 162, HDL 40, LDL 74 ~  FLP 6/12 showed TChol 174, TG 172, HDL 49, LDL 91... We discussed a better low fat diet.  DM (ICD-250.00) - prev on Glucovance 2.5-500 one tab in AM & switched to Metformin 500Qam in 2010... weight is maintained in the 160# range on diet... BS at home are "all good" per pt although he didn't bring list or recall the numbers... We increased the Metformin 500mg Bid 6/11> ~  labs 6/08 showed BS= 73, HgA1c= 6.5 ~  labs 6/09 showed BS= 90, A1c= 6.4 ~  labs 6/10 showed BS= 66, A1c= 6.3.Marland KitchenMarland Kitchen rec change to Metformin 500/d  alone. ~  labs 6/11 showed BS= 137, A1c= 7.6.Marland KitchenMarland Kitchen rec to incr the METFORMIN 500mg Bid. ~  Labs 6/12 showed BS= 119, A1c wasn't done...  HIATAL HERNIA (ICD-553.3) - off Nexium since he stopped the Indocin Rx... EGD was performed in 1997 showing mild duodenitis only... he has a hx of treated HPylori in 1990 and recurrent PUD in the past...  DIVERTICULOSIS OF COLON (ICD-562.10) - last colonoscopy 6/07 by [facility] showed divertics only... prev colon was in 2000 showing divertics, int hems, no polyps...  Hx of CARCINOMA, PROSTATE (ICD-185) - eval and Rx by [facility] in 2006 w/ radium seed implantation by [facility]... BENIGN PROSTATIC HYPERTROPHY, HX OF (ICD-V13.8) - hx BPH w/ BNC per [facility]... not on meds ~  labs 6/09 showed PSA= 0.32 ~  labs 6/10: PSA not done- followed by [facility] yearly. ~  labs  6/11 showed PSA = 0.07 ~  Pt tells me that PSAs are followed by [facility] now> note of 2/12 reviewed.  DEGENERATIVE JOINT DISEASE (ICD-715.90) - hx prev left shoulder surg & c/o left shoulder pain after pressure washing... Hx of GOUT (ICD-274.9) - on ALLOPURINOL 300mg /d & off INDOCIN now... ~  labs 6/09 showed Uric= 3.6 ~  labs 6/10 showed Uric= 4.5 ~  labs 6/11 showed Uric = 4.7  ANXIETY (ICD-300.00) - uses ALPRAZOLAM 0.5mg - 1/2 to 1 tab Tid Prn...  GENERAL HEALTH:  he is an ex-smoker having quit over 47yrs ago...    he had Pneumovax in 2001 & Tetanus shot in 2002 & TDAP 6/12...   Past Surgical History  Procedure Date  . Adenocarcinoma of the prostate with seed implantation 2006  . Appendectomy   . Tonsillectomy   . Right knee surgery   . Left shoulder surgery   . Left cataract surgery 10/2008    Dr. Emmit Cohen    Outpatient Encounter Prescriptions as of 11/02/2010  Medication Sig Dispense Refill  . allopurinol (ZYLOPRIM) 300 MG tablet Take 300 mg by mouth daily.        Marland Kitchen ALPRAZolam (XANAX) 0.5 MG tablet Take 1/2 to 1 tablet by mouth 3 times daily as needed for nerves  30 tablet  0  . aspirin 81 MG tablet Take 325 mg by mouth daily.       . indapamide (LOZOL) 2.5 MG tablet TAKE 1 TABLET BY MOUTH EVERY DAY  30 tablet  2  . metFORMIN (GLUCOPHAGE) 500 MG tablet TAKE 1 TABLET BY MOUTH TWICE A DAY  60 tablet  2  . Multiple Vitamins-Minerals (CENTRUM SILVER PO) Take 1 tablet by mouth daily.        . Multiple Vitamins-Minerals (PRESERVISION/LUTEIN) CAPS Take 1 capsule by mouth daily.        Bertram Gala Glycol-Propyl Glycol (SYSTANE ULTRA) 0.4-0.3 % SOLN Apply 1 drop to eye 2 (two) times daily. In left eye       . simvastatin (ZOCOR) 20 MG tablet Take 20 mg by mouth at bedtime.        . valsartan (DIOVAN) 80 MG tablet Take 80 mg by mouth daily.        Marland Kitchen loratadine (CLARITIN) 10 MG tablet Take 10 mg by mouth daily.          Allergies  Allergen Reactions  . Atenolol     REACTION: INTOL  Atenolol w/ bradycardia  . Codeine Phosphate     REACTION: nausea    Review of Systems        The patient complains of dyspnea on exertion, joint pain,  stiffness, anxiety, and hay fever.  The patient denies fever, chills, sweats, anorexia, fatigue, weakness, malaise, weight loss, sleep disorder, blurring, diplopia, eye irritation, eye discharge, vision loss, eye pain, photophobia, earache, ear discharge, tinnitus, decreased hearing, nasal congestion, nosebleeds, sore throat, hoarseness, chest pain, palpitations, syncope, orthopnea, PND, peripheral edema, cough, dyspnea at rest, excessive sputum, hemoptysis, wheezing, pleurisy, nausea, vomiting, diarrhea, constipation, change in bowel habits, abdominal pain, melena, hematochezia, jaundice, gas/bloating, indigestion/heartburn, dysphagia, odynophagia, dysuria, hematuria, urinary frequency, urinary hesitancy, nocturia, incontinence, back pain, joint swelling, muscle cramps, muscle weakness, arthritis, sciatica, restless legs, leg pain at night, leg pain with exertion, rash, itching, dryness, suspicious lesions, paralysis, paresthesias, seizures, tremors, vertigo, transient blindness, frequent falls, frequent headaches, difficulty walking, depression, memory loss, confusion, cold intolerance, heat intolerance, polydipsia, polyphagia, polyuria, unusual weight change, abnormal bruising, bleeding, enlarged lymph nodes, urticaria, allergic rash, and recurrent infections.    Objective:   Physical Exam      WD, WN, 77 y/o WM in NAD... GENERAL:  Alert & oriented; pleasant & cooperative... HEENT:  [place]/AT, EOM-wnl, PERRLA, EACs-clear, TMs-wnl, NOSE-clear, THROAT-clear & wnl. NECK:  Supple w/ fairROM; no JVD; normal carotid impulses w/o bruits; no thyromegaly or nodules palpated; no lymphadenopathy. CHEST:  Clear to Randy & A; without wheezes/ rales/ or rhonchi. HEART:  Regular Rhythm; without murmurs/ rubs/ or gallops. ABDOMEN:  Soft & nontender; normal bowel  sounds; no organomegaly or masses detected. RECTAL:  sl atrophic testes, no lesions, no hernia felt; rectal: palp prostate w/o nodule, stool brn heme neg. EXT: without deformities, mild arthritic changes; no varicose veins/ venous insuffic/ or edema. decr ROM left shoulder... NEURO:  CN's intact;  no focal neuro deficits... DERM:  No lesions noted; no rash etc...   Assessment & Plan:   HBP>  Remains controlled on Diovan & Lozol, continue same...  CHOL>  FLP looks OK on the Simva20 but TG sl too high & he is reminded about low fat diet...  DM>  He's kept his wt down, looks good, FBS= 119 but A1c was omitted; we reviewed diet Rx, continue same med.  GI> HH, Divertics>  Followed by [facility] & up to date; he knows he can take Prilosec OTC prn...  Prostate Cancer>  Followed by [facility] & he does his PSAs...  DJD & GOUT>  He is on Allopurinol prevention, doing well, uses OTC anlagesics as needed...  Anxiety>  He has Alprazolam 0.5mg  as needed for nerves.Marland KitchenMarland Kitchen

## 2010-11-02 NOTE — Patient Instructions (Signed)
Today we updated your med list in EPIC>    We refilled your meds for the next yr...  Today we did your follow up CXR & fasting blood work...    Please call the [facility]REE in a few days for your results...    Dial [id number] & when prompted enter your patient number followed by the # symbol...    Your patient number is:  [id number]#  Call for any problems...  Let's plan another routine follow up in 1 year, sooner if needed for problems.Marland KitchenMarland Kitchen

## 2010-11-03 ENCOUNTER — Other Ambulatory Visit: Payer: Self-pay | Admitting: Pulmonary Disease

## 2010-11-14 ENCOUNTER — Encounter: Payer: Self-pay | Admitting: Pulmonary Disease

## 2010-11-27 ENCOUNTER — Telehealth: Payer: Self-pay | Admitting: Pulmonary Disease

## 2010-11-27 NOTE — Telephone Encounter (Signed)
Called spoke with patient and wife who states patient drove to [place] approx 2 weeks ago and since has has left hip discomfort that radiates down to the left ankle.  Described as a "steady ache."  Reports has been using an otc muscle rub that has helped with the pain but is requesting a refill on indomethacin > indomethacin 75mg  was removed from med list in emr 10/2008.  [facility].  Okay with call-back tomorrow.  Patient and [place] - requesting a call back before then.  Okay to LM.

## 2010-11-28 MED ORDER — INDOMETHACIN 25 MG PO CAPS: 25.0000 mg | ORAL_CAPSULE | Freq: Two times a day (BID) | ORAL | Status: DC

## 2010-11-28 NOTE — Telephone Encounter (Signed)
Per SN: indomethacin 25mg  #50 (generic okay) 1 by mouth bid with food.  I suggest ortho appt.  Thanks.

## 2010-11-28 NOTE — Telephone Encounter (Signed)
rx sent. [id number] to advise of ortho rec. Carron Curie, CMA

## 2010-11-29 NOTE — Telephone Encounter (Signed)
Spoke with pt's spouse and notified of recs per SN. She states that before we send referral, she prefers that we speak with pt's daughter, [name] first, " to see what she thinks". Will await a call back from her.

## 2010-12-04 NOTE — Telephone Encounter (Signed)
Spoke with [facility]-pt is doing well with Rx given to patient and doesn't want ortho referral.

## 2010-12-27 ENCOUNTER — Other Ambulatory Visit: Payer: Self-pay | Admitting: Pulmonary Disease

## 2011-02-03 ENCOUNTER — Other Ambulatory Visit: Payer: Self-pay | Admitting: Pulmonary Disease

## 2011-03-24 ENCOUNTER — Other Ambulatory Visit: Payer: Self-pay | Admitting: Pulmonary Disease

## 2011-05-07 ENCOUNTER — Encounter (INDEPENDENT_AMBULATORY_CARE_PROVIDER_SITE_OTHER): Payer: Medicare Other | Admitting: Ophthalmology

## 2011-05-07 DIAGNOSIS — H2510 Age-related nuclear cataract, unspecified eye: Secondary | ICD-10-CM

## 2011-05-07 DIAGNOSIS — E11319 Type 2 diabetes mellitus with unspecified diabetic retinopathy without macular edema: Secondary | ICD-10-CM

## 2011-05-07 DIAGNOSIS — H3530 Unspecified macular degeneration: Secondary | ICD-10-CM

## 2011-05-07 DIAGNOSIS — H43819 Vitreous degeneration, unspecified eye: Secondary | ICD-10-CM

## 2011-05-07 DIAGNOSIS — E1139 Type 2 diabetes mellitus with other diabetic ophthalmic complication: Secondary | ICD-10-CM

## 2011-07-19 ENCOUNTER — Other Ambulatory Visit: Payer: Self-pay | Admitting: *Deleted

## 2011-07-19 MED ORDER — ALPRAZOLAM 0.5 MG PO TABS: ORAL_TABLET | ORAL | Status: DC

## 2011-09-13 ENCOUNTER — Encounter (INDEPENDENT_AMBULATORY_CARE_PROVIDER_SITE_OTHER): Payer: Medicare Other | Admitting: Ophthalmology

## 2011-09-13 DIAGNOSIS — H43819 Vitreous degeneration, unspecified eye: Secondary | ICD-10-CM

## 2011-09-13 DIAGNOSIS — H2510 Age-related nuclear cataract, unspecified eye: Secondary | ICD-10-CM

## 2011-09-13 DIAGNOSIS — E1165 Type 2 diabetes mellitus with hyperglycemia: Secondary | ICD-10-CM

## 2011-09-13 DIAGNOSIS — H3530 Unspecified macular degeneration: Secondary | ICD-10-CM

## 2011-09-13 DIAGNOSIS — H26499 Other secondary cataract, unspecified eye: Secondary | ICD-10-CM

## 2011-09-26 ENCOUNTER — Ambulatory Visit (INDEPENDENT_AMBULATORY_CARE_PROVIDER_SITE_OTHER): Payer: Medicare Other | Admitting: Ophthalmology

## 2011-09-26 DIAGNOSIS — H2700 Aphakia, unspecified eye: Secondary | ICD-10-CM

## 2011-09-26 DIAGNOSIS — H3530 Unspecified macular degeneration: Secondary | ICD-10-CM

## 2011-10-08 ENCOUNTER — Encounter: Payer: Medicare Other | Admitting: Pulmonary Disease

## 2011-10-09 ENCOUNTER — Other Ambulatory Visit: Payer: Self-pay | Admitting: *Deleted

## 2011-10-09 MED ORDER — ALPRAZOLAM 0.5 MG PO TABS: ORAL_TABLET | ORAL | Status: DC

## 2011-10-30 ENCOUNTER — Ambulatory Visit: Payer: Medicare Other | Admitting: Pulmonary Disease

## 2011-11-02 ENCOUNTER — Ambulatory Visit (INDEPENDENT_AMBULATORY_CARE_PROVIDER_SITE_OTHER): Payer: Medicare Other | Admitting: Pulmonary Disease

## 2011-11-02 ENCOUNTER — Encounter: Payer: Self-pay | Admitting: Pulmonary Disease

## 2011-11-02 VITALS — BP 120/60 | HR 51 | Temp 96.9°F | Ht 68.0 in | Wt 166.4 lb

## 2011-11-02 DIAGNOSIS — K449 Diaphragmatic hernia without obstruction or gangrene: Secondary | ICD-10-CM

## 2011-11-02 DIAGNOSIS — Z87898 Personal history of other specified conditions: Secondary | ICD-10-CM

## 2011-11-02 DIAGNOSIS — M109 Gout, unspecified: Secondary | ICD-10-CM

## 2011-11-02 DIAGNOSIS — F411 Generalized anxiety disorder: Secondary | ICD-10-CM

## 2011-11-02 DIAGNOSIS — E119 Type 2 diabetes mellitus without complications: Secondary | ICD-10-CM

## 2011-11-02 DIAGNOSIS — C61 Malignant neoplasm of prostate: Secondary | ICD-10-CM

## 2011-11-02 DIAGNOSIS — E7800 Pure hypercholesterolemia, unspecified: Secondary | ICD-10-CM

## 2011-11-02 DIAGNOSIS — M1990 Unspecified osteoarthritis, unspecified site: Secondary | ICD-10-CM

## 2011-11-02 DIAGNOSIS — I10 Essential (primary) hypertension: Secondary | ICD-10-CM

## 2011-11-02 DIAGNOSIS — K5730 Diverticulosis of large intestine without perforation or abscess without bleeding: Secondary | ICD-10-CM

## 2011-11-02 MED ORDER — SIMVASTATIN 20 MG PO TABS: 20.0000 mg | ORAL_TABLET | Freq: Every day | ORAL | Status: DC

## 2011-11-02 MED ORDER — VALSARTAN 80 MG PO TABS: 80.0000 mg | ORAL_TABLET | Freq: Every day | ORAL | Status: DC

## 2011-11-02 MED ORDER — INDAPAMIDE 2.5 MG PO TABS: 2.5000 mg | ORAL_TABLET | Freq: Every day | ORAL | Status: DC

## 2011-11-02 MED ORDER — ALLOPURINOL 300 MG PO TABS: 300.0000 mg | ORAL_TABLET | Freq: Every day | ORAL | Status: DC

## 2011-11-02 NOTE — Patient Instructions (Addendum)
Today we updated your med list in our EPIC system...    Continue your current medications the same...  You have been taking the INDOCIN (Indomethacin) twice daily every day...    Try to decrease this med to once a day, & if you are doing well w/o much arthritis pain, then try to cut it back further to just AS NEEDED for pain...  Today we did your follow up non-fasting blood work...    We will call you w/ the results next week...  Call for any problems or if we can be of servioce in any way...  Let's plan a follow up visit in 1 yr, sooner if needed.Marland KitchenMarland Kitchen

## 2011-11-02 NOTE — Progress Notes (Addendum)
Subjective:    Patient ID: [facility], male    DOB: [birth year], 78 y.o.   MRN: [id number]  HPI 78 y/o Randy [facility] here for a yearly check up and refill of meds...   ~  Jun10:  [place], [place] are looking to move back to the [place] area soon... [facility] noted decr vision in his left eye recently- seen by [facility]- sent to DrEpes for cataract surg done yest 10/21/08... notes persistant decr vision in left eye and due for f/u w/ poss eval by DrMatthews to check retina...  ~  October 27, 2009:  Randy [facility] has had a good year x c/o vision (see below)... [facility] exercises doing yard work & denies CP, palpit, ch in SOB, edema, etc... BP controlled on meds;  Chol OK on the Simva20;  last yr DM med changed to plain Metformin but A1c is 7.6 now & [facility] needs to incr to Bid...  ~  November 02, 2010:  1 Year ROV & Rohrman appears to be doing well, stable, no new complaints or concerns other than some mild hip discomfort that [facility] treats w/ OTC anlagesics prn (R[facility] didn't get to talk much as his wife kept interrupting w/ issues of her own)...     BP controlled on Diovan & Lozol; Randy [facility] denies CP, palpit, dizzy, ch in SOB, edema, etc; Randy Cohen continues to exercise by doing yard work & denies any difficulty; Chol looks OK on Simva20 but needs better low fat diet for the TGs; DM control appears OK on the Metformin monotherapy; Hx Prostate Ca & followed by DrGrapey, last seen 2/12 & doing satis; OK TDAP today>>  ~  November 02, 2011:  67yr ROV & [place] has no complaints, says Randy Cohen's had a good yr> BP controlled & denies CP, palpit, SOB, edema; Randy Cohen is not fasting for blood work today & [facility] Cohen will return next wk for FASTING labs;  Randy Cohen saw DrGrapey 5/13> hx prostate cancer s/p seed implant 10/06 & PSA has been declining & is basically zero (5/13=0.07), voiding satis, uses Viagra prn...    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  ALLERGIC RHINITIS (ICD-477.9) - takes CLARITIN daily & c/o runny nose...  HYPERTENSION (ICD-401.9) - on ASA 325mg /d, DIOVAN 80mg /d & LOZOL 2.5mg /d...  ~  NuclearStressTest 3/05 showed scarring at the base, no ischemia, EF=60%... ~  6/09: BP was sl low & we stopped Lozol, but Randy Cohen had to restart due to BP elevation off this med. ~  6/10: CXR- clear; Labs- all WNL.Marland Kitchen. continue same Rx & monitor at home. ~  6/12:  BP= 116/70 and Randy Cohen reports BP's all ~120/80 at home; feeling well & denies HA, fatigue, visual changes, CP, palipit, dizziness, syncope, dyspnea, edema, etc...  ~  6/13:  BP= 120/60 & Randy Cohen remains asymptomatic; Randy Cohen stays active by walking & mowing yards...  HYPERCHOLESTEROLEMIA (ICD-272.0) - on SIMVASTATIN 20mg /d... ~  FLP 6/08 showed TChol 149, TG 59, HDL 43, LDL 94 ~  FLP 6/09 showed TChol 155, TG 145, HDL 41, LDL 85 ~  FLP 6/10 showed TChol 162, TG 113, HDL  42, LDL 98 ~  FLP 6/11 showed TChol 146, TG 162, HDL 40, LDL 74 ~  FLP 6/12 on Simva20 showed TChol 174, TG 172, HDL 49, LDL 91... We discussed a better low fat diet. ~  FLP 7/13 on Simva20 ==> wasn't done  DM (ICD-250.00) - prev on Glucovance 2.5-500 one tab in AM & switched to Metformin 500Qam in 2010... weight is maintained in the 160# range on diet... BS at home are "all good" per pt although [facility] didn't bring list or recall the numbers... We increased the Metformin 500mg Bid 6/11> ~  labs 6/08 showed BS= 73, HgA1c= 6.5 ~  labs 6/09 showed BS= 90, A1c= 6.4 ~  labs 6/10 showed BS= 66, A1c= 6.3.Marland KitchenMarland Kitchen rec change to Metformin 500/d alone. ~  labs 6/11 showed BS= 137, A1c= 7.6.Marland KitchenMarland Kitchen rec to incr the METFORMIN 500mg Bid. ~  Labs 6/12 on Metform500Bid showed BS= 119, A1c wasn't done... ~  Labs 7/13 on Metform500Bid showed BS=  121, A1c= 7.5; we decided to keep same meds...  HIATAL HERNIA (ICD-553.3) - off Nexium since Randy Cohen stopped the Indocin Rx... EGD was performed in 1997 showing mild duodenitis only... [facility] has a hx of treated HPylori in 1990 and recurrent PUD in the past...  DIVERTICULOSIS OF COLON (ICD-562.10) - last colonoscopy 6/07 by DrPatterson showed divertics only... prev colon was in 2000 showing divertics, int hems, no polyps...  Hx of CARCINOMA, PROSTATE (ICD-185) - eval and Rx by DrGrapey in 2006 w/ radium seed implantation by DrMurray... BENIGN PROSTATIC HYPERTROPHY, HX OF (ICD-V13.8) - hx BPH w/ BNC per DrGrapey... not on meds ~  labs 6/09 showed PSA= 0.32 ~  labs 6/10: PSA not done- followed by DrGrapey yearly. ~  labs 6/11 showed PSA = 0.07 ~  Pt tells me that PSAs are followed by DrGrapey now> note of 2/12 reviewed. ~  5/13: Randy Cohen had yearly ROV w/ DrGrapey> doing well & PSA= 0.07  DEGENERATIVE JOINT DISEASE (ICD-715.90) - hx prev left shoulder surg & c/o left shoulder pain after pressure washing... Hx of GOUT (ICD-274.9) - on ALLOPURINOL 300mg /d & off INDOCIN now (just uses prn)... ~  labs 6/09 showed Uric= 3.6 ~  labs 6/10 showed Uric= 4.5 ~  labs 6/11 showed Uric = 4.7  ANXIETY (ICD-300.00) - uses ALPRAZOLAM 0.5mg - 1/2 to 1 tab Tid Prn...  GENERAL HEALTH:  Randy Cohen is an ex-smoker having quit over 71yrs ago...    Randy Cohen had Pneumovax in 2001 & Tetanus shot in 2002 & TDAP 6/12...   Past Surgical History  Procedure Date  . Adenocarcinoma of the prostate with seed implantation 2006  . Appendectomy   . Tonsillectomy   . Right knee surgery   . Left shoulder surgery   . Left cataract surgery 10/2008    Dr. Emmit Pomfret    Outpatient Encounter Prescriptions as of 11/02/2011  Medication Sig Dispense Refill  . allopurinol (ZYLOPRIM) 300 MG tablet TAKE 1 TABLET BY MOUTH EVERY DAY  30 tablet  11  . ALPRAZolam (XANAX) 0.5 MG tablet Take 1/2 to 1 tablet by mouth 3 times daily as needed for nerves  90 tablet  0  .  aspirin 325 MG tablet Take 325 mg by mouth daily.      Marland Kitchen DIOVAN 80 MG tablet TAKE 1 TABLET BY MOUTH EVERY DAY  30 tablet  11  . indapamide (LOZOL) 2.5 MG tablet Take 1 tablet (2.5 mg total) by mouth daily.  30 tablet  11  . indomethacin (INDOCIN) 25 MG capsule TAKE  1 CAPSULE (25 MG TOTAL) BY MOUTH 2 (TWO) TIMES DAILY WITH A MEAL.  60 capsule  5  . metFORMIN (GLUCOPHAGE) 500 MG tablet Take 1 tablet (500 mg total) by mouth 2 (two) times daily with a meal.  60 tablet  11  . Multiple Vitamins-Minerals (CENTRUM SILVER PO) Take 1 tablet by mouth daily.        . Multiple Vitamins-Minerals (PRESERVISION/LUTEIN) CAPS Take 1 capsule by mouth daily.        . simvastatin (ZOCOR) 20 MG tablet TAKE 1 TABLET BY MOUTH EVERY DAY  30 tablet  11  . DISCONTD: aspirin 81 MG tablet Take 325 mg by mouth daily.       Marland Kitchen DISCONTD: loratadine (CLARITIN) 10 MG tablet Take 10 mg by mouth daily.        Marland Kitchen DISCONTD: Polyethyl Glycol-Propyl Glycol (SYSTANE ULTRA) 0.4-0.3 % SOLN Apply 1 drop to eye 2 (two) times daily. In left eye         Allergies  Allergen Reactions  . Atenolol     REACTION: INTOL Atenolol w/ bradycardia  . Codeine Phosphate     REACTION: nausea    Review of Systems        The patient complains of dyspnea on exertion, joint pain, stiffness, anxiety, and hay fever.  The patient denies fever, chills, sweats, anorexia, fatigue, weakness, malaise, weight loss, sleep disorder, blurring, diplopia, eye irritation, eye discharge, vision loss, eye pain, photophobia, earache, ear discharge, tinnitus, decreased hearing, nasal congestion, nosebleeds, sore throat, hoarseness, chest pain, palpitations, syncope, orthopnea, PND, peripheral edema, cough, dyspnea at rest, excessive sputum, hemoptysis, wheezing, pleurisy, nausea, vomiting, diarrhea, constipation, change in bowel habits, abdominal pain, melena, hematochezia, jaundice, gas/bloating, indigestion/heartburn, dysphagia, odynophagia, dysuria, hematuria, urinary  frequency, urinary hesitancy, nocturia, incontinence, back pain, joint swelling, muscle cramps, muscle weakness, arthritis, sciatica, restless legs, leg pain at night, leg pain with exertion, rash, itching, dryness, suspicious lesions, paralysis, paresthesias, seizures, tremors, vertigo, transient blindness, frequent falls, frequent headaches, difficulty walking, depression, memory loss, confusion, cold intolerance, heat intolerance, polydipsia, polyphagia, polyuria, unusual weight change, abnormal bruising, bleeding, enlarged lymph nodes, urticaria, allergic rash, and recurrent infections.    Objective:   Physical Exam      WD, WN, 78 y/o [facility] in NAD... GENERAL:  Alert & oriented; pleasant & cooperative... HEENT:  [place]/AT, EOM-wnl, PERRLA, EACs-clear, TMs-wnl, NOSE-clear, THROAT-clear & wnl. NECK:  Supple w/ fairROM; no JVD; normal carotid impulses w/o bruits; no thyromegaly or nodules palpated; no lymphadenopathy. CHEST:  Clear to P & A; without wheezes/ rales/ or rhonchi. HEART:  Regular Rhythm; without murmurs/ rubs/ or gallops. ABDOMEN:  Soft & nontender; normal bowel sounds; no organomegaly or masses detected. EXT: without deformities, mild arthritic changes; no varicose veins/ venous insuffic/ or edema. decr ROM left shoulder... NEURO:  CN's intact;  no focal neuro deficits... DERM:  No lesions noted; no rash etc...  RADIOLOGY DATA:  Reviewed in the EPIC EMR & discussed w/ the patient...  LABORATORY DATA:  Reviewed in the EPIC EMR & discussed w/ the patient...   Assessment & Plan:   HBP>  Remains controlled on Diovan & Lozol, continue same...  CHOL>  FLP last yr OK on the Simva20 but TG sl too high & Randy Cohen is reminded about low fat diet...  DM>  Randy Cohen's kept his wt down, looks good, FBS= 121 but A1c= 7.5; we reviewed diet Rx, continue same med for now...  GI> HH, Divertics>  Followed by DrPatterson & up to date; Randy Cohen  knows Randy Cohen can take Prilosec OTC prn...  Prostate Cancer>  Followed by  DrGrapey & Randy Cohen does his PSAs...  DJD & GOUT>  Randy Cohen is on Allopurinol prevention, doing well, uses OTC anlagesics as needed...  Anxiety>  Randy Cohen has Alprazolam 0.5mg  as needed for nerves...   Patient's Medications  New Prescriptions   No medications on file  Previous Medications   ALPRAZOLAM (XANAX) 0.5 MG TABLET    Take 1/2 to 1 tablet by mouth 3 times daily as needed for nerves   ASPIRIN 325 MG TABLET    Take 325 mg by mouth daily.   INDOMETHACIN (INDOCIN) 25 MG CAPSULE    TAKE 1 CAPSULE (25 MG TOTAL) BY MOUTH 2 (TWO) TIMES DAILY WITH A MEAL.   METFORMIN (GLUCOPHAGE) 500 MG TABLET    Take 1 tablet (500 mg total) by mouth 2 (two) times daily with a meal.   MULTIPLE VITAMINS-MINERALS (CENTRUM SILVER PO)    Take 1 tablet by mouth daily.     MULTIPLE VITAMINS-MINERALS (PRESERVISION/LUTEIN) CAPS    Take 1 capsule by mouth daily.    Modified Medications   Modified Medication Previous Medication   ALLOPURINOL (ZYLOPRIM) 300 MG TABLET allopurinol (ZYLOPRIM) 300 MG tablet      Take 1 tablet (300 mg total) by mouth daily.    TAKE 1 TABLET BY MOUTH EVERY DAY   INDAPAMIDE (LOZOL) 2.5 MG TABLET indapamide (LOZOL) 2.5 MG tablet      Take 1 tablet (2.5 mg total) by mouth daily.    Take 1 tablet (2.5 mg total) by mouth daily.   SIMVASTATIN (ZOCOR) 20 MG TABLET simvastatin (ZOCOR) 20 MG tablet      Take 1 tablet (20 mg total) by mouth at bedtime.    TAKE 1 TABLET BY MOUTH EVERY DAY   VALSARTAN (DIOVAN) 80 MG TABLET DIOVAN 80 MG tablet      Take 1 tablet (80 mg total) by mouth daily.    TAKE 1 TABLET BY MOUTH EVERY DAY  Discontinued Medications   ASPIRIN 81 MG TABLET    Take 325 mg by mouth daily.    LORATADINE (CLARITIN) 10 MG TABLET    Take 10 mg by mouth daily.     POLYETHYL GLYCOL-PROPYL GLYCOL (SYSTANE ULTRA) 0.4-0.3 % SOLN    Apply 1 drop to eye 2 (two) times daily. In left eye

## 2011-11-11 ENCOUNTER — Other Ambulatory Visit: Payer: Self-pay | Admitting: Pulmonary Disease

## 2011-11-12 ENCOUNTER — Telehealth: Payer: Self-pay | Admitting: Pulmonary Disease

## 2011-11-12 ENCOUNTER — Other Ambulatory Visit: Payer: Self-pay | Admitting: Pulmonary Disease

## 2011-11-12 NOTE — Telephone Encounter (Signed)
I spoke with spouse and is aware labs are already placed in the system. Nothing further was needed

## 2011-11-13 ENCOUNTER — Other Ambulatory Visit (INDEPENDENT_AMBULATORY_CARE_PROVIDER_SITE_OTHER): Payer: Medicare Other

## 2011-11-13 DIAGNOSIS — I10 Essential (primary) hypertension: Secondary | ICD-10-CM

## 2011-11-13 DIAGNOSIS — E119 Type 2 diabetes mellitus without complications: Secondary | ICD-10-CM

## 2011-11-13 DIAGNOSIS — K5730 Diverticulosis of large intestine without perforation or abscess without bleeding: Secondary | ICD-10-CM

## 2011-11-13 DIAGNOSIS — E7800 Pure hypercholesterolemia, unspecified: Secondary | ICD-10-CM

## 2011-11-13 DIAGNOSIS — F411 Generalized anxiety disorder: Secondary | ICD-10-CM

## 2011-11-13 LAB — BASIC METABOLIC PANEL
BUN: 24 mg/dL — ABNORMAL HIGH (ref 6–23)
GFR: 68.01 mL/min (ref >60.00)
Glucose, Bld: 121 mg/dL — ABNORMAL HIGH (ref 70–99)
Potassium: 3.7 mEq/L (ref 3.5–5.1)

## 2011-11-13 LAB — HEPATIC FUNCTION PANEL
ALT: 12 U/L (ref 0–53)
AST: 17 U/L (ref 0–37)
Albumin: 3.9 g/dL (ref 3.5–5.2)

## 2011-11-13 LAB — CBC WITH DIFFERENTIAL/PLATELET
Eosinophils Relative: 3.4 % (ref 0.0–5.0)
Lymphocytes Relative: 24.9 % (ref 12.0–46.0)
Monocytes Relative: 6.9 % (ref 3.0–12.0)
Neutrophils Relative %: 64.3 % (ref 43.0–77.0)
Platelets: 165 10*3/uL (ref 150.0–400.0)
WBC: 7.3 10*3/uL (ref 4.5–10.5)

## 2011-11-13 LAB — HEMOGLOBIN A1C: Hgb A1c MFr Bld: 7.5 % — ABNORMAL HIGH (ref 4.6–6.5)

## 2011-11-13 LAB — TSH: TSH: 2.78 u[IU]/mL (ref 0.35–5.50)

## 2011-11-19 ENCOUNTER — Other Ambulatory Visit: Payer: Self-pay | Admitting: Pulmonary Disease

## 2011-11-19 MED ORDER — ALPRAZOLAM 0.5 MG PO TABS: ORAL_TABLET | ORAL | Status: DC

## 2011-11-21 ENCOUNTER — Other Ambulatory Visit: Payer: Self-pay | Admitting: Pulmonary Disease

## 2011-12-28 ENCOUNTER — Telehealth: Payer: Self-pay | Admitting: Pulmonary Disease

## 2011-12-28 NOTE — Telephone Encounter (Signed)
Called and spoke with pts wife Randy Cohen and i advised her of the pts lab results.  i did call the and spoke with Randy Cohen on 7-11 and gave her the pts lab results but she does not remember this.  She did write down the results of the labs and will give these results to her daughter today.  Nothing further is needed.

## 2012-01-11 ENCOUNTER — Other Ambulatory Visit: Payer: Self-pay | Admitting: Pulmonary Disease

## 2012-01-30 ENCOUNTER — Other Ambulatory Visit: Payer: Self-pay | Admitting: Pulmonary Disease

## 2012-02-24 ENCOUNTER — Other Ambulatory Visit: Payer: Self-pay | Admitting: Pulmonary Disease

## 2012-02-25 ENCOUNTER — Telehealth: Payer: Self-pay | Admitting: Pulmonary Disease

## 2012-02-25 NOTE — Telephone Encounter (Signed)
I spoke with spouse and advised her it does not show [place] [facility] dermatology appt time/date. i advised her to call their office regarding this. She voiced her understanding and will do so. Nothing further was needed

## 2012-02-25 NOTE — Telephone Encounter (Signed)
i do not see an order nor do i see it in the chart Randy Cohen

## 2012-03-30 ENCOUNTER — Other Ambulatory Visit: Payer: Self-pay | Admitting: Pulmonary Disease

## 2012-04-13 ENCOUNTER — Other Ambulatory Visit: Payer: Self-pay | Admitting: Pulmonary Disease

## 2012-05-06 ENCOUNTER — Ambulatory Visit (INDEPENDENT_AMBULATORY_CARE_PROVIDER_SITE_OTHER): Payer: Medicare Other | Admitting: Ophthalmology

## 2012-05-09 ENCOUNTER — Other Ambulatory Visit: Payer: Self-pay | Admitting: Pulmonary Disease

## 2012-05-10 ENCOUNTER — Other Ambulatory Visit: Payer: Self-pay | Admitting: Pulmonary Disease

## 2012-06-04 ENCOUNTER — Ambulatory Visit (INDEPENDENT_AMBULATORY_CARE_PROVIDER_SITE_OTHER): Payer: Medicare Other | Admitting: Ophthalmology

## 2012-06-07 DIAGNOSIS — H2102 Hyphema, left eye: Secondary | ICD-10-CM

## 2012-06-07 HISTORY — DX: Hyphema, left eye: H21.02

## 2012-06-17 ENCOUNTER — Encounter (INDEPENDENT_AMBULATORY_CARE_PROVIDER_SITE_OTHER): Payer: Medicare Other | Admitting: Ophthalmology

## 2012-06-17 DIAGNOSIS — H3530 Unspecified macular degeneration: Secondary | ICD-10-CM

## 2012-06-17 DIAGNOSIS — H2100 Hyphema, unspecified eye: Secondary | ICD-10-CM

## 2012-06-17 DIAGNOSIS — H43819 Vitreous degeneration, unspecified eye: Secondary | ICD-10-CM

## 2012-06-17 DIAGNOSIS — H4010X0 Unspecified open-angle glaucoma, stage unspecified: Secondary | ICD-10-CM

## 2012-06-18 ENCOUNTER — Encounter (INDEPENDENT_AMBULATORY_CARE_PROVIDER_SITE_OTHER): Payer: Medicare Other | Admitting: Ophthalmology

## 2012-10-01 ENCOUNTER — Ambulatory Visit (INDEPENDENT_AMBULATORY_CARE_PROVIDER_SITE_OTHER): Payer: Medicare Other | Admitting: Ophthalmology

## 2012-10-02 ENCOUNTER — Other Ambulatory Visit: Payer: Self-pay | Admitting: Pulmonary Disease

## 2012-10-14 ENCOUNTER — Other Ambulatory Visit: Payer: Self-pay | Admitting: Pulmonary Disease

## 2012-10-14 DIAGNOSIS — H547 Unspecified visual loss: Secondary | ICD-10-CM

## 2012-10-14 DIAGNOSIS — E119 Type 2 diabetes mellitus without complications: Secondary | ICD-10-CM

## 2012-10-14 DIAGNOSIS — C61 Malignant neoplasm of prostate: Secondary | ICD-10-CM

## 2012-10-14 DIAGNOSIS — R413 Other amnesia: Secondary | ICD-10-CM

## 2012-10-21 ENCOUNTER — Telehealth: Payer: Self-pay | Admitting: Pulmonary Disease

## 2012-10-21 NOTE — Telephone Encounter (Signed)
lmomtcb for Randy Cohen. I didn't see this fax at the front.  So, I spoke with Leigh.  She hasn't seen this either.

## 2012-10-22 NOTE — Telephone Encounter (Signed)
LM for Tara to call back.

## 2012-10-23 NOTE — Telephone Encounter (Signed)
lmtcb

## 2012-10-24 NOTE — Telephone Encounter (Signed)
Spoke with Leigh and she has not seen any fax on this pt so I LMTCB with Delice Bison to advise to re-fax. Carron Curie, CMA

## 2012-10-27 NOTE — Telephone Encounter (Signed)
Per protocol I will sign off on message. Shanty Ginty, CMA  

## 2012-11-03 ENCOUNTER — Ambulatory Visit: Payer: Medicare Other | Admitting: Pulmonary Disease

## 2013-01-27 ENCOUNTER — Telehealth: Payer: Self-pay | Admitting: Pulmonary Disease

## 2013-01-27 ENCOUNTER — Ambulatory Visit (INDEPENDENT_AMBULATORY_CARE_PROVIDER_SITE_OTHER): Payer: Medicare Other | Admitting: Pulmonary Disease

## 2013-01-27 ENCOUNTER — Encounter: Payer: Self-pay | Admitting: Pulmonary Disease

## 2013-01-27 ENCOUNTER — Other Ambulatory Visit (INDEPENDENT_AMBULATORY_CARE_PROVIDER_SITE_OTHER): Payer: Medicare Other

## 2013-01-27 VITALS — BP 132/80 | HR 57 | Temp 97.3°F | Ht 68.0 in | Wt 150.0 lb

## 2013-01-27 DIAGNOSIS — K449 Diaphragmatic hernia without obstruction or gangrene: Secondary | ICD-10-CM

## 2013-01-27 DIAGNOSIS — E7800 Pure hypercholesterolemia, unspecified: Secondary | ICD-10-CM

## 2013-01-27 DIAGNOSIS — I10 Essential (primary) hypertension: Secondary | ICD-10-CM

## 2013-01-27 DIAGNOSIS — K5730 Diverticulosis of large intestine without perforation or abscess without bleeding: Secondary | ICD-10-CM

## 2013-01-27 DIAGNOSIS — M1990 Unspecified osteoarthritis, unspecified site: Secondary | ICD-10-CM

## 2013-01-27 DIAGNOSIS — C61 Malignant neoplasm of prostate: Secondary | ICD-10-CM

## 2013-01-27 DIAGNOSIS — E119 Type 2 diabetes mellitus without complications: Secondary | ICD-10-CM

## 2013-01-27 DIAGNOSIS — N320 Bladder-neck obstruction: Secondary | ICD-10-CM

## 2013-01-27 DIAGNOSIS — H547 Unspecified visual loss: Secondary | ICD-10-CM

## 2013-01-27 DIAGNOSIS — M109 Gout, unspecified: Secondary | ICD-10-CM

## 2013-01-27 DIAGNOSIS — F411 Generalized anxiety disorder: Secondary | ICD-10-CM

## 2013-01-27 LAB — LIPID PANEL
Cholesterol: 234 mg/dL — ABNORMAL HIGH (ref 0–200)
HDL: 47.7 mg/dL (ref >39.00)
Triglycerides: 171 mg/dL — ABNORMAL HIGH (ref 0.0–149.0)
VLDL: 34.2 mg/dL (ref 0.0–40.0)

## 2013-01-27 LAB — HEPATIC FUNCTION PANEL
Albumin: 4.1 g/dL (ref 3.5–5.2)
Total Bilirubin: 0.7 mg/dL (ref 0.3–1.2)

## 2013-01-27 LAB — CBC WITH DIFFERENTIAL/PLATELET
Basophils Relative: 1.6 % (ref 0.0–3.0)
Eosinophils Relative: 1 % (ref 0.0–5.0)
HCT: 41.8 % (ref 39.0–52.0)
Lymphocytes Relative: 28.4 % (ref 12.0–46.0)
Lymphs Abs: 1.8 10*3/uL (ref 0.7–4.0)
MCV: 91.8 fl (ref 78.0–100.0)
Monocytes Absolute: 0.4 10*3/uL (ref 0.1–1.0)
Monocytes Relative: 6.7 % (ref 3.0–12.0)
Platelets: 236 10*3/uL (ref 150.0–400.0)
RBC: 4.55 Mil/uL (ref 4.22–5.81)
RDW: 13.1 % (ref 11.5–14.6)
WBC: 6.2 10*3/uL (ref 4.5–10.5)

## 2013-01-27 LAB — LDL CHOLESTEROL, DIRECT: Direct LDL: 160 mg/dL

## 2013-01-27 LAB — BASIC METABOLIC PANEL
BUN: 16 mg/dL (ref 6–23)
Calcium: 9.4 mg/dL (ref 8.4–10.5)
Creatinine, Ser: 1 mg/dL (ref 0.4–1.5)
GFR: 74.75 mL/min (ref >60.00)

## 2013-01-27 LAB — HEMOGLOBIN A1C: Hgb A1c MFr Bld: 8.1 % — ABNORMAL HIGH (ref 4.6–6.5)

## 2013-01-27 LAB — TSH: TSH: 1.62 u[IU]/mL (ref 0.35–5.50)

## 2013-01-27 NOTE — Telephone Encounter (Signed)
Made in error. Randy Cohen  °

## 2013-01-27 NOTE — Patient Instructions (Addendum)
Today we updated your med list in our EPIC system...    Continue your current medications the same...  Today we did your follow up blood work...    We will contact you w/ the results when available...     We will then decide about medication adjustments...  [name] will fill a weekly medication box for you...  Call for any questions...  Let's plan a follow up visit in 22mo, sooner if needed for problems.Marland KitchenMarland Kitchen

## 2013-01-27 NOTE — Progress Notes (Signed)
Subjective:    Patient ID: Randy Cohen, male    DOB: Jul 05, 1931, 79 y.o.   MRN: [id number]  HPI 80 y/o WM here for a yearly check up and refill of meds...   ~  [place], [place] area soon... he noted decr vision in his left eye recently- [facility] by [facility]- sent to DrEpes for cataract surg done yest 10/21/08... notes persistant decr vision in left eye and due for f/u w/ poss eval by DrMatthews to check retina...  ~  October 27, 2009:  he has had a good year x c/o vision (see below)... he exercises doing yard work & denies CP, palpit, ch in SOB, edema, etc... BP controlled on meds;  Chol OK on the Simva20;  last yr DM med changed to plain Metformin but A1c is 7.6 now & he needs to incr to Bid...  ~  November 02, 2010:  1 Year ROV & Randy Cohen appears to be doing well, stable, no new complaints or concerns other than some mild hip discomfort that he treats w/ OTC anlagesics prn (he didn't get to talk much as his wife kept interrupting w/ issues of her own)...     BP controlled on Diovan & Lozol; he denies CP, palpit, dizzy, ch in SOB, edema, etc; he continues to exercise by doing yard work & denies any difficulty; Chol looks OK on Simva20 but needs better low fat diet for the TGs; DM control appears OK on the Metformin monotherapy; Hx Prostate Ca & followed by DrGrapey, last seen 2/12 & doing satis; OK TDAP today>>  ~  November 02, 2011:  85yr ROV & Randy Cohen has no complaints, says he's had a good yr> BP controlled & denies CP, palpit, SOB, edema; he is not fasting for blood work today & he will return next wk for FASTING labs;  He saw DrGrapey 5/13> hx prostate cancer s/p seed implant 10/06 & PSA has been declining & is basically zero (5/13=0.07), voiding satis, uses Viagra prn...    We reviewed prob list, meds, xrays and labs> see below>> LABS 7/13:  FLP- wasn't done;  Chems- ok x BS=121 A1c=7.5 on MetformBid;  CBC- ok w/  Hg=12.6;  TSH=2.78   ~  January 27, 2013:  5mo ROV & Randy Cohen returns w/ wife & daugh for f/u and "refill of meds"; the family situation is well known to [place]- Randy Cohen is illiterate & relies on his wife but Randy Cohen has a dementia/ psyche illness and has to be handled very carefully (adifficult journey for daughter Randy Cohen); wife has been letting [name] do his own meds but he doesn't have a clue & it is apparent that he is not taking anything regularly so we used this f/u visit to reassess his needs for any meds... We reviewed the following medical problems during today's office visit >>     Visual loss> followed by DrMatthews [facility] on eye drops, decr vision in left eye (?blood behind the eye), we don't have notes...    HBP> on Diovan80-?taking1/2 daily, off prev Lozol2.5;  BP= 132/80 & he denies CP, palpit, dizzy, SOB, edema; note: wt is down 16# to 150#; decided to continue off BP meds & follow.    Chol> supposed to be on Simva20 but not taking; FLP 9/14 showed TChol 234, TG 171, HDL 48, LDL 160; Rec to start Simva20 Qd...    DM> supposed to  be on Metform500Bid but not taking regularly;  Labs 9/14 showed BS=110, A1c=8.1 & rec to take Metform500bid w/ supervision & monitoring for compliance...    GI- HH, Divertics> not on meds; hx PUD & +HPylori in past (trated); last colon 2007 by DrPattersonw/ divertics only...    Ca Prostate, BPH> eval & Rx per DrGrapey 2006- they opted for radium seed implants; PSA has remained low 0.07 in 2013...    DJD, Hx Gout> hx prev left shoulder surg in past & hx gout- prev on Allopurinol300 & Indocin25 prn but not taking therefore stop...    ?Early Dementia> has not had scans & has not seen Neurology; he is very pleasant & relies on his wife since he can't read; not on meds...    Anxiety> prev on Alpraz0.5mg  as needed but not using so we will remiove from his med list... We reviewed prob list, meds, xrays and labs> see below for updates >> we discussed need for Randy Cohen to supervise &  monitor all meds... LABS 9/14:  FLP- not at goals off Simva;  Chems- ok x BS=110 A1c=8.1;  CBC- wnl;  TSH=1.62;  PSA=0.03...  PLAN>> get on the Metform500Bid & Simva20; Officially STOP & remove Allopurinol, Alprazolam, Indocin, Indapamide, Diovan...          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  ALLERGIC RHINITIS (ICD-477.9) - takes CLARITIN as needed  & c/o runny nose...  HYPERTENSION (ICD-401.9) >>  ~  prev on ASA 325mg /d, DIOVAN 80mg /d & LOZOL 2.5mg /d...  ~  NuclearStressTest 3/05 showed scarring at the base, no ischemia, EF=60%... ~  6/09: BP was sl low & we stopped Lozol, but he had to restart due to BP elevation off this med. ~  6/10: CXR- clear; Labs- all WNL.Marland Kitchen. continue same Rx & monitor at home. ~  6/12:  BP= 116/70 and he reports BP's all ~120/80 at home; feeling well & denies HA, fatigue, visual changes, CP, palipit, dizziness, syncope, dyspnea, edema, etc...  ~  6/13:  BP= 120/60 & he remains asymptomatic; he stays active by walking & mowing yards... ~  9/14:  He has not been taking any meds regularly; BP= 132/80 & we decided to officially STOP the Diovan & Lozol, monitor BP at home, call for problems...  HYPERCHOLESTEROLEMIA (ICD-272.0) - prev on SIMVASTATIN 20mg /d but wasn't taking it regularly... ~  FLP 6/08 showed TChol 149, TG 59, HDL 43, LDL 94 ~  FLP 6/09 showed TChol 155, TG 145, HDL 41, LDL 85 ~  FLP 6/10 showed TChol 162, TG 113, HDL 42, LDL 98 ~  FLP 6/11 showed TChol 146, TG 162, HDL 40, LDL 74 ~  FLP 6/12 on Simva20 showed TChol 174, TG 172, HDL 49, LDL 91... We discussed a better low fat diet. ~  FLP 7/13 on Simva20 ==> wasn't done ~  FLP 9/14 off Simva20 (not taking) showed TChol 234, TG 171, HDL 48, LDL 160... Rec to restart Simva20 & med monitoring from daugh.  DM (ICD-250.00) - prev on  Glucovance 2.5-500 one tab in AM & switched to Metformin 500Qam in 2010... weight is maintained in the 160# range on diet... BS at home are "all good" per pt although he didn't bring list or recall the numbers... We increased the Metformin 500mg Bid 6/11> ~  labs 6/08 showed BS=  73, HgA1c= 6.5 ~  labs 6/09 showed BS= 90, A1c= 6.4 ~  labs 6/10 showed BS= 66, A1c= 6.3.Marland KitchenMarland Kitchen rec change to Metformin 500/d alone. ~  labs 6/11 showed BS= 137, A1c= 7.6.Marland KitchenMarland Kitchen rec to incr the METFORMIN 500mg Bid. ~  Labs 6/12 on Metform500Bid showed BS= 119, A1c wasn't done... ~  Labs 7/13 on Metform500Bid showed BS= 121, A1c= 7.5; we decided to keep same meds... ~  Labs 9/14 on Metform500Bid but not taking regularly; Labs showed BS=110, A1c=8.1; Rec to restart Metform500Bid 7 monitor compliance by daugh.  HIATAL HERNIA (ICD-553.3) - off Nexium since he stopped the Indocin Rx... EGD was performed in 1997 showing mild duodenitis only... he has a hx of treated HPylori in 1990 and recurrent PUD in the past...  DIVERTICULOSIS OF COLON (ICD-562.10) - last colonoscopy 6/07 by DrPatterson showed divertics only... prev colon was in 2000 showing divertics, int hems, no polyps...  Hx of CARCINOMA, PROSTATE (ICD-185) - eval and Rx by DrGrapey in 2006 w/ radium seed implantation by DrMurray... BENIGN PROSTATIC HYPERTROPHY, HX OF (ICD-V13.8) - hx BPH w/ BNC per DrGrapey... not on meds ~  labs 6/09 showed PSA= 0.32 ~  labs 6/10: PSA not done- followed by DrGrapey yearly. ~  labs 6/11 showed PSA = 0.07 ~  Pt tells me that PSAs are followed by DrGrapey now> note of 2/12 reviewed. ~  5/13: he had yearly ROV w/ DrGrapey> doing well & PSA= 0.07 ~  Labs 9/14 showed PSA= 0.03  DEGENERATIVE JOINT DISEASE (ICD-715.90) - hx prev left shoulder surg & c/o left shoulder pain after pressure washing... Hx of GOUT (ICD-274.9) - prev on ALLOPURINOL 300mg /d & off INDOCIN now (just uses prn)... ~  labs 6/09 showed Uric= 3.6 ~  labs 6/10 showed Uric= 4.5 ~   labs 6/11 showed Uric = 4.7 ~  9/14:  He has not been taking the allopurinol regularly & hasn't needed the Indocin; Uric level wasn't done- we decided to stop both meds for now...  ANXIETY (ICD-300.00) - prev used ALPRAZOLAM 0.5mg - 1/2 to 1 tab Tid Prn; hasn't taken any in some time & we decided to remove from his list...  GENERAL HEALTH:  he is an ex-smoker having quit over 2yrs ago...    he had Pneumovax in 2001 & Tetanus shot in 2002 & TDAP 6/12...   Past Surgical History  Procedure Laterality Date  . 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 Appendectomy    . Tonsillectomy    . Right knee surgery    . Left shoulder surgery    . Left cataract surgery  10/2008    Dr. Emmit Pomfret    Outpatient Encounter Prescriptions as of 01/27/2013  Medication Sig Dispense Refill  . allopurinol (ZYLOPRIM) 300 MG tablet Take 1 tablet (300 mg total) by mouth daily.  30 tablet  11  . ALPRAZolam (XANAX) 0.5 MG tablet TAKE 1/2 - 1 TABLET BY MOUTH 3 TIMES A DAY AS NEEDED FOR NERVES  90 tablet  0  . aspirin 81 MG tablet Take 81 mg by mouth daily.      Marland Kitchen atropine 1 % ophthalmic solution Place 1 drop into the left eye daily.      . Difluprednate (DUREZOL) 0.05 % EMUL Apply 1 drop to eye daily.      Marland Kitchen DIOVAN 80 MG tablet TAKE 1 TABLET BY MOUTH EVERY DAY  30 tablet  5  . indapamide (LOZOL) 2.5 MG tablet TAKE 1 TABLET BY MOUTH EVERY  DAY  30 tablet  8  . indomethacin (INDOCIN) 25 MG capsule TAKE 1 CAPSULE BY MOUTH TWICE DAILY WITH FOOD  60 capsule  6  . metFORMIN (GLUCOPHAGE) 500 MG tablet TAKE 1 TABLET BY MOUTH TWICE A DAY WITH MEAL  60 tablet  5  . Multiple Vitamins-Minerals (PRESERVISION/LUTEIN) CAPS Take 1 capsule by mouth daily.        . simvastatin (ZOCOR) 20 MG tablet TAKE 1 TABLET BY MOUTH AT BEDTIME  30 tablet  8  . [DISCONTINUED] allopurinol (ZYLOPRIM) 300 MG tablet TAKE 1 TABLET BY MOUTH EVERY DAY  30 tablet  9  . [DISCONTINUED] aspirin 325 MG tablet Take 325 mg by mouth daily.      .  [DISCONTINUED] indapamide (LOZOL) 2.5 MG tablet TAKE 1 TABLET BY MOUTH EVERY DAY  30 tablet  11  . [DISCONTINUED] indapamide (LOZOL) 2.5 MG tablet TAKE 1 TABLET BY MOUTH EVERY DAY  30 tablet  11  . [DISCONTINUED] Multiple Vitamins-Minerals (CENTRUM SILVER PO) Take 1 tablet by mouth daily.        . [DISCONTINUED] simvastatin (ZOCOR) 20 MG tablet TAKE 1 TABLET BY MOUTH AT BEDTIME  30 tablet  11  . [DISCONTINUED] simvastatin (ZOCOR) 20 MG tablet TAKE 1 TABLET BY MOUTH AT BEDTIME  30 tablet  5  . [DISCONTINUED] valsartan (DIOVAN) 80 MG tablet Take 1 tablet (80 mg total) by mouth daily.  30 tablet  11   No facility-administered encounter medications on file as of 01/27/2013.    Allergies  Allergen Reactions  . Atenolol     REACTION: INTOL Atenolol w/ bradycardia  . Codeine Phosphate     REACTION: nausea    Current Medications, Allergies, Past Medical History, Past Surgical History, Family History, and Social History were reviewed in [employer] record.   Review of Systems        The patient complains of dyspnea on exertion, joint pain, stiffness, anxiety, and hay fever.  The patient denies fever, chills, sweats, anorexia, fatigue, weakness, malaise, weight loss, sleep disorder, blurring, diplopia, eye irritation, eye discharge, vision loss, eye pain, photophobia, earache, ear discharge, tinnitus, decreased hearing, nasal congestion, nosebleeds, sore throat, hoarseness, chest pain, palpitations, syncope, orthopnea, PND, peripheral edema, cough, dyspnea at rest, excessive sputum, hemoptysis, wheezing, pleurisy, nausea, vomiting, diarrhea, constipation, change in bowel habits, abdominal pain, melena, hematochezia, jaundice, gas/bloating, indigestion/heartburn, dysphagia, odynophagia, dysuria, hematuria, urinary frequency, urinary hesitancy, nocturia, incontinence, back pain, joint swelling, muscle cramps, muscle weakness, arthritis, sciatica, restless legs, leg pain at night, leg  pain with exertion, rash, itching, dryness, suspicious lesions, paralysis, paresthesias, seizures, tremors, vertigo, transient blindness, frequent falls, frequent headaches, difficulty walking, depression, memory loss, confusion, cold intolerance, heat intolerance, polydipsia, polyphagia, polyuria, unusual weight change, abnormal bruising, bleeding, enlarged lymph nodes, urticaria, allergic rash, and recurrent infections.    Objective:   Physical Exam      WD, WN, 79 y/o WM in NAD... GENERAL:  Alert & oriented; pleasant & cooperative... HEENT:  [place]/AT, EOM-wnl, PERRLA, EACs-clear, TMs-wnl, NOSE-clear, THROAT-clear & wnl. NECK:  Supple w/ fairROM; no JVD; normal carotid impulses w/o bruits; no thyromegaly or nodules palpated; no lymphadenopathy. CHEST:  Clear to P & A; without wheezes/ rales/ or rhonchi. HEART:  Regular Rhythm; without murmurs/ rubs/ or gallops. ABDOMEN:  Soft & nontender; normal bowel sounds; no organomegaly or masses detected. EXT: without deformities, mild arthritic changes; no varicose veins/ venous insuffic/ or edema. decr ROM left shoulder... NEURO:  CN's intact;  no focal neuro deficits.Marland KitchenMarland Kitchen  DERM:  No lesions noted; no rash etc...  RADIOLOGY DATA:  Reviewed in the EPIC EMR & discussed w/ the patient...  LABORATORY DATA:  Reviewed in the EPIC EMR & discussed w/ the patient...   Assessment & Plan:    HBP>  He has lost 16# down to 150#; BP is satis off meds (not taking his Diovan & Lozol) & we decided to leave off meds & monitor BP...  CHOL>  FLP is way off on diet alone since he hasn't been taking his Simva20; Rec to restart Simva20 & monitor compliance...  DM>  It appears that he is not taking his meds regularly; BS=110 A1c=8.1 & Rec to restart Metform500Bid...  GI> HH, Divertics>  Followed by DrPatterson & up to date; he knows he can take Prilosec OTC prn...  Prostate Cancer>  Followed by DrGrapey & PSA is good at 0.03...  DJD & GOUT>  He is prob not taking  Allopurinol & hasn't needed the Indocin; Uric wasn't done as requested; we decided to remove these meds from his list...  Anxiety>  He had Alprazolam 0.5mg  as needed for nerves; not taking & we will stop this as well...   Patient's Medications  New Prescriptions   No medications on file  Previous Medications   ALLOPURINOL (ZYLOPRIM) 300 MG TABLET NOT TAKING REGULARLY; URIC= wasn't done =>     Take 1 tablet (300 mg total) by mouth daily.        REC- STOP ALLOPURINOL FOR NOW...   ALPRAZOLAM (XANAX) 0.5 MG TABLET        NOT TAKING & WE DECIDED TO STOP...    TAKE 1/2 - 1 TABLET BY MOUTH 3 TIMES A DAY AS NEEDED FOR NERVES   ASPIRIN 81 MG TABLET    Take 81 mg by mouth daily.   ATROPINE 1 % OPHTHALMIC SOLUTION    Place 1 drop into the left eye daily.   DIFLUPREDNATE (DUREZOL) 0.05 % EMUL    Apply 1 drop to eye daily.   INDOMETHACIN (INDOCIN) 25 MG CAPSULE        NOT TAKING & WE DECIDED TO STOP...    TAKE 1 CAPSULE BY MOUTH TWICE DAILY WITH FOOD   METFORMIN (GLUCOPHAGE) 500 MG TABLET    TAKE 1 TABLET BY MOUTH TWICE A DAY WITH MEAL   MULTIPLE VITAMINS-MINERALS (PRESERVISION/LUTEIN) CAPS    Take 1 capsule by mouth daily.     SIMVASTATIN (ZOCOR) 20 MG TABLET    TAKE 1 TABLET BY MOUTH AT BEDTIME  Modified Medications   Modified Medication Previous Medication   INDAPAMIDE (LOZOL) 2.5 MG TABLET indapamide (LOZOL) 2.5 MG tablet       NOT TAKING & WE DECIDED TO STOP...    TAKE 1 TABLET BY MOUTH EVERY DAY   VALSARTAN (DIOVAN) 80 MG TABLET DIOVAN 80 MG tablet       NOT TAKING & WE DECIDED TO STOP...    TAKE 1 TABLET BY MOUTH EVERY DAY  Discontinued Medications   ALLOPURINOL (ZYLOPRIM) 300 MG TABLET    TAKE 1 TABLET BY MOUTH EVERY DAY   ASPIRIN 325 MG TABLET    Take 325 mg by mouth daily.   INDAPAMIDE (LOZOL) 2.5 MG TABLET    TAKE 1 TABLET BY MOUTH EVERY DAY   INDAPAMIDE (LOZOL) 2.5 MG TABLET    TAKE 1 TABLET BY MOUTH EVERY DAY   MULTIPLE VITAMINS-MINERALS (CENTRUM SILVER PO)    Take 1 tablet by mouth  daily.  SIMVASTATIN (ZOCOR) 20 MG TABLET    TAKE 1 TABLET BY MOUTH AT BEDTIME   SIMVASTATIN (ZOCOR) 20 MG TABLET    TAKE 1 TABLET BY MOUTH AT BEDTIME   VALSARTAN (DIOVAN) 80 MG TABLET    Take 1 tablet (80 mg total) by mouth daily.

## 2013-01-30 ENCOUNTER — Telehealth: Payer: Self-pay | Admitting: Pulmonary Disease

## 2013-01-30 NOTE — Telephone Encounter (Signed)
Pt daughter called to get results for pt and mother. I provided the results. She states that at visit they spoke about the pt not taking his BP meds since May 2014. She states she verified with the pharmacy and this is correct. She wanted to know should the pt start back on diovan 80 daily and lozol 2.5 daily or continue off of them. Pt BP at visit on 01-29-13 was 132/80. Please advise. Carron Curie, CMA Allergies  Allergen Reactions  . Atenolol     REACTION: INTOL Atenolol w/ bradycardia  . Codeine Phosphate     REACTION: nausea

## 2013-01-30 NOTE — Telephone Encounter (Signed)
Called and spoke with Randy Cohen and she is aware that per SN---with his weight loss and good BP in the office and no edema---he will need to leave the meds off.  Watch BP at home and call for any concerns.

## 2013-02-06 ENCOUNTER — Telehealth: Payer: Self-pay | Admitting: Pulmonary Disease

## 2013-02-06 NOTE — Telephone Encounter (Signed)
Called and spoke with patsy and she is aware of SN recs for the medications.  She stated that she is going to try the pill  Box again.  She stated that the pt was only taking 1 dose a day and only did this for 3 days.  pts wife got mad at patsy and made her bring all the pill bottles back.  [name] stated that she is going to try the pill box again to see if she can get them using this to take the medications like they are supposed to.  [name] will keep a check on the BP for the pt and call for any concerns.  pts med list has been update.

## 2013-03-03 ENCOUNTER — Telehealth: Payer: Self-pay | Admitting: Pulmonary Disease

## 2013-03-03 NOTE — Telephone Encounter (Signed)
I spoke with [name]. Back at pts [facility] in September they were told to hold pt BP medications since he was not taking it. She reports every time she checks his BP it is ranging 150-160/80. She checks the BP about once a week. Pt still has old bp medications at home but they are put away since these were held diovan 80 and lozol 2.5 mg. She has full log of BP at home. Is pt to restart these medications? Or keep monitoring. Please advise SN thanks

## 2013-03-04 NOTE — Telephone Encounter (Signed)
[name] advised. Carron Curie, CMA

## 2013-03-04 NOTE — Telephone Encounter (Signed)
lmomtcb x1 for [employer]

## 2013-03-04 NOTE — Telephone Encounter (Signed)
Per SN---  He would rec restarting the diovan 80.  And continue to monitor the BP.

## 2013-03-18 ENCOUNTER — Other Ambulatory Visit: Payer: Self-pay | Admitting: Pulmonary Disease

## 2013-03-20 ENCOUNTER — Other Ambulatory Visit: Payer: Self-pay | Admitting: Pulmonary Disease

## 2013-03-26 ENCOUNTER — Telehealth: Payer: Self-pay | Admitting: *Deleted

## 2013-03-26 NOTE — Telephone Encounter (Signed)
PA for indomethacin 25 mg 1 po BID Phone # [phone] This was approved until 05/06/14 [id number] approval # [facility] is aware of approval

## 2013-05-01 ENCOUNTER — Telehealth: Payer: Self-pay | Admitting: Pulmonary Disease

## 2013-05-01 NOTE — Telephone Encounter (Signed)
Called and spoke with patsy and she stated that her sister is going to come in with pt on Monday to his appt.  They are going to need help with placement for the pt since he will not be able to stay alone.  [name] stated that she has all of his medications fixed so he has been taking his meds regularly.  She stated that he has been having irregular HR when she is taking his BP.    She stated that she has still not been able to get in contact with her brother that has POA over the both parents.  [name] stated that she will type all of this up and have her sister bring this to the appt on Monday.

## 2013-05-04 ENCOUNTER — Ambulatory Visit (INDEPENDENT_AMBULATORY_CARE_PROVIDER_SITE_OTHER): Payer: Medicare Other | Admitting: Pulmonary Disease

## 2013-05-04 ENCOUNTER — Encounter: Payer: Self-pay | Admitting: Pulmonary Disease

## 2013-05-04 VITALS — BP 148/70 | HR 78 | Temp 97.5°F | Ht 68.0 in | Wt 143.4 lb

## 2013-05-04 DIAGNOSIS — I10 Essential (primary) hypertension: Secondary | ICD-10-CM

## 2013-05-04 DIAGNOSIS — C61 Malignant neoplasm of prostate: Secondary | ICD-10-CM

## 2013-05-04 DIAGNOSIS — K449 Diaphragmatic hernia without obstruction or gangrene: Secondary | ICD-10-CM

## 2013-05-04 DIAGNOSIS — K5730 Diverticulosis of large intestine without perforation or abscess without bleeding: Secondary | ICD-10-CM

## 2013-05-04 DIAGNOSIS — E119 Type 2 diabetes mellitus without complications: Secondary | ICD-10-CM

## 2013-05-04 DIAGNOSIS — E7800 Pure hypercholesterolemia, unspecified: Secondary | ICD-10-CM

## 2013-05-04 MED ORDER — SIMVASTATIN 20 MG PO TABS: ORAL_TABLET | ORAL | Status: DC

## 2013-05-04 MED ORDER — METFORMIN HCL 500 MG PO TABS: ORAL_TABLET | ORAL | Status: DC

## 2013-05-04 MED ORDER — TOBRAMYCIN 0.3 % OP OINT: TOPICAL_OINTMENT | OPHTHALMIC | Status: DC

## 2013-05-04 MED ORDER — DILTIAZEM HCL 120 MG PO TABS: 120.0000 mg | ORAL_TABLET | Freq: Every day | ORAL | Status: DC

## 2013-05-04 MED ORDER — VALSARTAN 80 MG PO TABS: 80.0000 mg | ORAL_TABLET | Freq: Every day | ORAL | Status: DC

## 2013-05-04 NOTE — Progress Notes (Signed)
Subjective:    Patient ID: Randy Cohen, male    DOB: Dec 06, 1931, 79 y.o.   MRN: [id number]  HPI 79 y/o WM here for a yearly check up and refill of meds...   ~  November 02, 2010:  1 Year ROV & Randy Cohen appears to be doing well, stable, no new complaints or concerns other than some mild hip discomfort that he treats w/ OTC anlagesics prn (he didn't get to talk much as his wife kept interrupting w/ issues of her own)...     BP controlled on Diovan & Lozol; he denies CP, palpit, dizzy, ch in SOB, edema, etc; he continues to exercise by doing yard work & denies any difficulty; Chol looks OK on Simva20 but needs better low fat diet for the TGs; DM control appears OK on the Metformin monotherapy; Hx Prostate Ca & followed by Randy Cohen, last seen 2/12 & doing satis; OK TDAP today>>  ~  November 02, 2011:  46yr ROV & Randy Cohen has no complaints, says he's had a good yr> BP controlled & denies CP, palpit, SOB, edema; he is not fasting for blood work today & he will return next wk for FASTING labs;  He saw Randy Cohen 5/13> hx prostate cancer s/p seed implant 10/06 & PSA has been declining & is basically zero (5/13=0.07), voiding satis, uses Viagra prn...    We reviewed prob list, meds, xrays and labs> see below>> LABS 7/13:  FLP- wasn't done;  Chems- ok x BS=121 A1c=7.5 on MetformBid;  CBC- ok w/ Hg=12.6;  TSH=2.78   ~  January 27, 2013:  79mo ROV & Randy Cohen returns w/ wife & daugh for f/u and "refill of meds"; the family situation is well known to [place]- Randy Cohen is illiterate & relies on his wife but Randy Cohen has a dementia/ psyche illness and has to be handled very carefully (adifficult journey for daughter Randy Cohen); wife has been letting [name] do his own meds but he doesn't have a clue & it is apparent that he is not taking anything regularly so we used this f/u visit to reassess his needs for any meds... We reviewed the following medical problems during today's office visit >>     Visual loss> followed by Randy Cohen [facility] on eye  drops, decr vision in left eye (?blood behind the eye), we don't have notes...    HBP> on Diovan80-?taking1/2 daily, off prev Lozol2.5;  BP= 132/80 & he denies CP, palpit, dizzy, SOB, edema; note: wt is down 16# to 150#; decided to continue off BP meds & follow.    Chol> supposed to be on Simva20 but not taking; FLP 9/14 showed TChol 234, TG 171, HDL 48, LDL 160; Rec to start Simva20 Qd...    DM> supposed to be on Metform500Bid but not taking regularly;  Labs 9/14 showed BS=110, A1c=8.1 & rec to take Metform500bid w/ supervision & monitoring for compliance...    GI- HH, Divertics> not on meds; hx PUD & +HPylori in past (trated); last colon 2007 by DrPattersonw/ divertics only...    Ca Prostate, BPH> eval & Rx per Randy Cohen 2006- they opted for radium seed implants; PSA has remained low 0.07 in 2013...    DJD, Hx Gout> hx prev left shoulder surg in past & hx gout- prev on Allopurinol300 & Indocin25 prn but not taking therefore stop...    ?Early Dementia> has not had scans & has not seen Neurology; he is very pleasant & relies on his wife since he can't read; not on meds.Marland KitchenMarland Kitchen  Anxiety> prev on Alpraz0.5mg  as needed but not using so we will remiove from his med list... We reviewed prob list, meds, xrays and labs> see below for updates >> we discussed need for Randy Cohen to supervise & monitor all meds... Given the 2014 Flu vaccine 9/14... LABS 9/14:  FLP- not at goals off Simva;  Chems- ok x BS=110 A1c=8.1;  CBC- wnl;  TSH=1.62;  PSA=0.03...  PLAN>> get on the Metform500Bid & Simva20; Officially STOP & remove Allopurinol, Alprazolam, Indocin, Indapamide, Diovan...  ~  May 04, 2013:  80mo ROV & Randy Cohen recently restarted her Dad back on the Diovan80 (problem was that her Mom wouldn't let her do his meds (& she is now in [facility]); he has a right eye conjunctivitis as well & we discussed Rx w/ Tobrex 1gtt Q4h (he is on a number of eye drop from his Ophthalmologist 7 will need to replace these w/  clean bottles & NOT touch the eye w/ the droppers); she has also noted some irregularlity in his pulse but pt denies CP, palpit, dizzy, SOB, etc...  EKG today shows NSR, rate70, no arrhythmias, minor NSSTTWA, NAD...      BP controlled on ASA81, Diovan80 and reads 148/70 today; he remains asymptomatic...    He remains on Simva20 for his Chol but Randy Cohen notes the bottle is empty 7 last filled 5/14...    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  ALLERGIC RHINITIS (ICD-477.9) - takes CLARITIN as needed  & c/o runny nose...  HYPERTENSION (ICD-401.9) >>  ~  prev on ASA 325mg /d, DIOVAN 80mg /d & LOZOL 2.5mg /d...  ~  NuclearStressTest 3/05 showed scarring at the base, no ischemia, EF=60%... ~  6/09: BP was sl low & we stopped Lozol, but he had to restart due to BP elevation off this med. ~  6/10: CXR- clear; Labs- all WNL.Marland Kitchen. continue same Rx & monitor at home. ~  6/12:  BP= 116/70 and he reports BP's all ~120/80 at home; feeling well & denies HA, fatigue, visual changes, CP, palipit, dizziness, syncope, dyspnea, edema, etc...  ~  6/13:  BP= 120/60 & he remains asymptomatic; he stays active by walking & mowing yards... ~  9/14:  He has not been taking any meds regularly; BP= 132/80 & we decided to officially STOP the Diovan & Lozol, monitor BP at home, call for problems... ~  12/14:  Just restarted on his Diovan80 & BP= 148/70 as noted...   HYPERCHOLESTEROLEMIA (ICD-272.0) - prev on SIMVASTATIN 20mg /d but wasn't taking it regularly... ~  FLP 6/08 showed TChol 149, TG 59, HDL 43, LDL 94 ~  FLP 6/09 showed TChol 155, TG 145, HDL 41, LDL 85 ~  FLP 6/10 showed TChol 162, TG 113,  HDL 42, LDL 98 ~  FLP 6/11 showed TChol 146, TG 162, HDL 40, LDL 74 ~  FLP 6/12 on Simva20 showed TChol 174, TG 172, HDL 49, LDL 91... We discussed a better low fat diet. ~  FLP 7/13 on Simva20 ==> wasn't done ~  FLP 9/14 off Simva20 (not taking) showed TChol 234, TG 171, HDL  48, LDL 160... Rec to restart Simva20 & med monitoring from daugh. ~  12/14: Randy Cohen notes he's been out of his meds since 5/14, refilled 7 we will recheck labs on return...  DM (ICD-250.00) - prev on Glucovance 2.5-500 one tab in AM & switched to Metformin 500Qam in 2010... weight is maintained in the 160# range on diet... BS at home are "all good" per pt although he didn't bring list or recall the numbers... We increased the Metformin 500mg Bid 6/11> ~  labs 6/08 showed BS= 73, HgA1c= 6.5 ~  labs 6/09 showed BS= 90, A1c= 6.4 ~  labs 6/10 showed BS= 66, A1c= 6.3.Marland KitchenMarland Kitchen rec change to Metformin 500/d alone. ~  labs 6/11 showed BS= 137, A1c= 7.6.Marland KitchenMarland Kitchen rec to incr the METFORMIN 500mg Bid. ~  Labs 6/12 on Metform500Bid showed BS= 119, A1c wasn't done... ~  Labs 7/13 on Metform500Bid showed BS= 121, A1c= 7.5; we decided to keep same meds... ~  Labs 9/14 on Metform500Bid but not taking regularly; Labs showed BS=110, A1c=8.1; Rec to restart Metform500Bid & monitor compliance by daugh.  HIATAL HERNIA (ICD-553.3) - off Nexium since he stopped the Indocin Rx... EGD was performed in 1997 showing mild duodenitis only... he has a hx of treated HPylori in 1990 and recurrent PUD in the past...  DIVERTICULOSIS OF COLON (ICD-562.10) - last colonoscopy 6/07 by Randy Cohen showed divertics only... prev colon was in 2000 showing divertics, int hems, no polyps...  Hx of CARCINOMA, PROSTATE (ICD-185) - eval and Rx by Randy Cohen in 2006 w/ radium seed implantation by DrMurray... BENIGN PROSTATIC HYPERTROPHY, HX OF (ICD-V13.8) - hx BPH w/ BNC per Randy Cohen... not on meds ~  labs 6/09 showed PSA= 0.32 ~  labs 6/10: PSA not done- followed by Randy Cohen yearly. ~   labs 6/11 showed PSA = 0.07 ~  Pt tells me that PSAs are followed by Randy Cohen now> note of 2/12 reviewed. ~  5/13: he had yearly ROV w/ Randy Cohen> doing well & PSA= 0.07 ~  Labs 9/14 showed PSA= 0.03  DEGENERATIVE JOINT DISEASE (ICD-715.90) - hx prev left shoulder surg & c/o left shoulder pain after pressure washing... Hx of GOUT (ICD-274.9) - prev on ALLOPURINOL 300mg /d & off INDOCIN now (just uses prn)... ~  labs 6/09 showed Uric= 3.6 ~  labs 6/10 showed Uric= 4.5 ~  labs 6/11 showed Uric = 4.7 ~  9/14:  He has not been taking the allopurinol regularly & hasn't needed the Indocin; Uric level wasn't done- we decided to stop both meds for now...  ANXIETY (ICD-300.00) - prev used ALPRAZOLAM 0.5mg - 1/2 to 1 tab Tid Prn; hasn't taken any in some time & we decided to remove from his list...  GENERAL HEALTH:  he is an ex-smoker having quit over 92yrs ago...    he had Pneumovax in 2001 & Tetanus shot in 2002 & TDAP 6/12...   Past Surgical History  Procedure Laterality Date  . 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 Appendectomy    . Tonsillectomy    . Right knee surgery    . Left shoulder surgery    . Left cataract surgery  10/2008    Dr. Emmit Pomfret    Outpatient Encounter Prescriptions as of 05/04/2013  Medication Sig  . allopurinol (ZYLOPRIM) 300 MG tablet TAKE 1 TABLET BY MOUTH EVERY DAY  . aspirin 81 MG tablet Take 81 mg by mouth daily.  Marland Kitchen atropine 1 % ophthalmic solution Place 1 drop into the left eye daily.  Marland Kitchen  Difluprednate (DUREZOL) 0.05 % EMUL 1 drop into left eye twice daily  . metFORMIN (GLUCOPHAGE) 500 MG tablet TAKE 1 TABLET BY MOUTH once  A DAY WITH MEAL  . Multiple Vitamins-Minerals (PRESERVISION/LUTEIN) CAPS Take 1 capsule by mouth daily.    . simvastatin (ZOCOR) 20 MG tablet TAKE 1 TABLET BY MOUTH AT BEDTIME  . valsartan (DIOVAN) 80 MG tablet Take 80 mg by mouth daily.  . [DISCONTINUED] metFORMIN (GLUCOPHAGE) 500 MG tablet TAKE 1 TABLET BY MOUTH TWICE A  DAY WITH MEAL  . indomethacin (INDOCIN) 25 MG capsule TAKE 1 CAPSULE BY MOUTH TWICE DAILY WITH FOOD    Allergies  Allergen Reactions  . Atenolol     REACTION: INTOL Atenolol w/ bradycardia  . Codeine Phosphate     REACTION: nausea    Current Medications, Allergies, Past Medical History, Past Surgical History, Family History, and Social History were reviewed in [employer] record.   Review of Systems        The patient complains of dyspnea on exertion, joint pain, stiffness, anxiety, and hay fever.  The patient denies fever, chills, sweats, anorexia, fatigue, weakness, malaise, weight loss, sleep disorder, blurring, diplopia, eye irritation, eye discharge, vision loss, eye pain, photophobia, earache, ear discharge, tinnitus, decreased hearing, nasal congestion, nosebleeds, sore throat, hoarseness, chest pain, palpitations, syncope, orthopnea, PND, peripheral edema, cough, dyspnea at rest, excessive sputum, hemoptysis, wheezing, pleurisy, nausea, vomiting, diarrhea, constipation, change in bowel habits, abdominal pain, melena, hematochezia, jaundice, gas/bloating, indigestion/heartburn, dysphagia, odynophagia, dysuria, hematuria, urinary frequency, urinary hesitancy, nocturia, incontinence, back pain, joint swelling, muscle cramps, muscle weakness, arthritis, sciatica, restless legs, leg pain at night, leg pain with exertion, rash, itching, dryness, suspicious lesions, paralysis, paresthesias, seizures, tremors, vertigo, transient blindness, frequent falls, frequent headaches, difficulty walking, depression, memory loss, confusion, cold intolerance, heat intolerance, polydipsia, polyphagia, polyuria, unusual weight change, abnormal bruising, bleeding, enlarged lymph nodes, urticaria, allergic rash, and recurrent infections.    Objective:   Physical Exam      WD, Thin, 79 y/o WM in NAD... GENERAL:  Alert & oriented; pleasant & cooperative... HEENT:  [place]/AT, EOM-wnl,  +Conjunctivitis OD, EACs-clear, TMs-wnl, NOSE-clear, THROAT-clear & wnl. NECK:  Supple w/ fairROM; no JVD; normal carotid impulses w/o bruits; no thyromegaly or nodules palpated; no lymphadenopathy. CHEST:  Clear to P & A; without wheezes/ rales/ or rhonchi. HEART:  Regular Rhythm; without murmurs/ rubs/ or gallops. ABDOMEN:  Soft & nontender; normal bowel sounds; no organomegaly or masses detected. EXT: without deformities, mild arthritic changes; no varicose veins/ venous insuffic/ or edema. decr ROM left shoulder... NEURO:  CN's intact;  no focal neuro deficits... DERM:  No lesions noted; no rash etc...  RADIOLOGY DATA:  Reviewed in the EPIC EMR & discussed w/ the patient...  LABORATORY DATA:  Reviewed in the EPIC EMR & discussed w/ the patient...   Assessment & Plan:    Conjunctivitis>> Rx w/ Tobrex eye gtts 7 refer to Oph if not improved quickly...   HBP>  He has lost 7# further down to 143#; BP is satis back on Diovan80 & Randy Cohen is monitoring his pressure...  CHOL>  FLP is way off on diet alone since he hasn't been taking his Simva20; Rec to restart Simva20 & monitor compliance...  DM>  It appears that he is not taking his meds regularly; BS=110 A1c=8.1 & Rec to restart Metform500Bid w/ Randy Cohen monitoring his med...  GI> HH, Divertics>  Followed by Randy Cohen & up to date; he knows he can take Prilosec OTC  prn...  Prostate Cancer>  Followed by Randy Cohen & PSA is good at 0.03...  DJD & GOUT>  He is prob not taking Allopurinol & hasn't needed the Indocin; Uric wasn't done as requested; we decided to remove these meds from his list...  Anxiety>  He had Alprazolam 0.5mg  as needed for nerves; not taking & we will stop this as well...   Patient's Medications  New Prescriptions   DILTIAZEM (CARDIZEM) 120 MG TABLET    Take 1 tablet (120 mg total) by mouth daily.   TOBRAMYCIN (TOBREX) 0.3 % OPHTHALMIC OINTMENT    1 drop in both eyes every 4 hours  Previous Medications   ASPIRIN 81  MG TABLET    Take 81 mg by mouth daily.   ATROPINE 1 % OPHTHALMIC SOLUTION    Place 1 drop into the left eye daily.   DIFLUPREDNATE (DUREZOL) 0.05 % EMUL    1 drop into left eye twice daily   MULTIPLE VITAMINS-MINERALS (PRESERVISION/LUTEIN) CAPS    Take 1 capsule by mouth daily.    Modified Medications   Modified Medication Previous Medication   METFORMIN (GLUCOPHAGE) 500 MG TABLET metFORMIN (GLUCOPHAGE) 500 MG tablet      TAKE 1 TABLET BY MOUTH once  A DAY WITH MEAL    TAKE 1 TABLET BY MOUTH TWICE A DAY WITH MEAL   SIMVASTATIN (ZOCOR) 20 MG TABLET simvastatin (ZOCOR) 20 MG tablet      TAKE 1 TABLET BY MOUTH AT BEDTIME    TAKE 1 TABLET BY MOUTH AT BEDTIME   VALSARTAN (DIOVAN) 80 MG TABLET valsartan (DIOVAN) 80 MG tablet      Take 1 tablet (80 mg total) by mouth daily.    Take 80 mg by mouth daily.  Discontinued Medications   ALLOPURINOL (ZYLOPRIM) 300 MG TABLET    TAKE 1 TABLET BY MOUTH EVERY DAY   INDOMETHACIN (INDOCIN) 25 MG CAPSULE    TAKE 1 CAPSULE BY MOUTH TWICE DAILY WITH FOOD   METFORMIN (GLUCOPHAGE) 500 MG TABLET    TAKE 1 TABLET BY MOUTH once  A DAY WITH MEAL

## 2013-05-04 NOTE — Patient Instructions (Signed)
Today we updated your med list in our EPIC system...    Continue your current medications the same...    We refilled your Diovan, Simvastatin, and Metformin...  For your conjunctivitis>>    We wrote for Tobrex eye drops> one drop in each eye every 4h    If the drainage and redness persists, then please follow up w/ your Ophthalmologist ASAP...  We did an EKG today & it showed an occais skip>    We are adding CardizemCD 120mg  one cap daily for this & let's continue to monitor your BP at home...  Call for any questions...  Let's plan a follow up visit in 18mo w/ FASTING labs at that time

## 2013-05-05 ENCOUNTER — Telehealth: Payer: Self-pay | Admitting: Pulmonary Disease

## 2013-05-05 NOTE — Telephone Encounter (Signed)
I called and spoke with [name]. She was calling to verify pt med list. Nothing further needed

## 2013-05-07 DIAGNOSIS — F0390 Unspecified dementia without behavioral disturbance: Secondary | ICD-10-CM

## 2013-05-07 HISTORY — DX: Unspecified dementia, unspecified severity, without behavioral disturbance, psychotic disturbance, mood disturbance, and anxiety: F03.90

## 2013-06-17 ENCOUNTER — Ambulatory Visit (INDEPENDENT_AMBULATORY_CARE_PROVIDER_SITE_OTHER): Payer: Medicare Other | Admitting: Ophthalmology

## 2013-07-27 ENCOUNTER — Ambulatory Visit: Payer: Medicare Other | Admitting: Pulmonary Disease

## 2013-08-04 ENCOUNTER — Ambulatory Visit: Payer: Medicare Other | Admitting: Pulmonary Disease

## 2013-10-26 ENCOUNTER — Telehealth: Payer: Self-pay | Admitting: Pulmonary Disease

## 2013-10-26 NOTE — Telephone Encounter (Signed)
Spoke with the pt's daughter and verified his med list Nothing further needed

## 2013-10-26 NOTE — Telephone Encounter (Signed)
Randy Cohen returned call, please call [phone]

## 2013-10-26 NOTE — Telephone Encounter (Signed)
LMTCB

## 2013-10-29 ENCOUNTER — Ambulatory Visit (INDEPENDENT_AMBULATORY_CARE_PROVIDER_SITE_OTHER): Payer: Medicare Other | Admitting: Ophthalmology

## 2013-10-29 DIAGNOSIS — H43819 Vitreous degeneration, unspecified eye: Secondary | ICD-10-CM

## 2013-10-29 DIAGNOSIS — H2510 Age-related nuclear cataract, unspecified eye: Secondary | ICD-10-CM

## 2013-10-29 DIAGNOSIS — H3530 Unspecified macular degeneration: Secondary | ICD-10-CM

## 2013-11-04 ENCOUNTER — Encounter: Payer: Self-pay | Admitting: Family Medicine

## 2013-11-04 ENCOUNTER — Ambulatory Visit (INDEPENDENT_AMBULATORY_CARE_PROVIDER_SITE_OTHER): Payer: Medicare Other | Admitting: Family Medicine

## 2013-11-04 ENCOUNTER — Telehealth: Payer: Self-pay | Admitting: Family Medicine

## 2013-11-04 VITALS — BP 114/73 | HR 96 | Temp 98.6°F | Resp 16 | Ht 68.0 in | Wt 146.0 lb

## 2013-11-04 DIAGNOSIS — E7800 Pure hypercholesterolemia, unspecified: Secondary | ICD-10-CM

## 2013-11-04 DIAGNOSIS — Z1283 Encounter for screening for malignant neoplasm of skin: Secondary | ICD-10-CM | POA: Insufficient documentation

## 2013-11-04 DIAGNOSIS — C61 Malignant neoplasm of prostate: Secondary | ICD-10-CM

## 2013-11-04 DIAGNOSIS — I10 Essential (primary) hypertension: Secondary | ICD-10-CM | POA: Insufficient documentation

## 2013-11-04 DIAGNOSIS — H547 Unspecified visual loss: Secondary | ICD-10-CM

## 2013-11-04 DIAGNOSIS — R413 Other amnesia: Secondary | ICD-10-CM

## 2013-11-04 DIAGNOSIS — IMO0001 Reserved for inherently not codable concepts without codable children: Secondary | ICD-10-CM

## 2013-11-04 DIAGNOSIS — E1165 Type 2 diabetes mellitus with hyperglycemia: Principal | ICD-10-CM

## 2013-11-04 LAB — COMPREHENSIVE METABOLIC PANEL
ALBUMIN: 4.2 g/dL (ref 3.5–5.2)
ALK PHOS: 64 U/L (ref 39–117)
ALT: 17 U/L (ref 0–53)
AST: 20 U/L (ref 0–37)
BUN: 15 mg/dL (ref 6–23)
CO2: 31 mEq/L (ref 19–32)
Calcium: 9.5 mg/dL (ref 8.4–10.5)
Chloride: 94 mEq/L — ABNORMAL LOW (ref 96–112)
Creatinine, Ser: 0.9 mg/dL (ref 0.4–1.5)
GFR: 87.31 mL/min (ref >60.00)
Glucose, Bld: 168 mg/dL — ABNORMAL HIGH (ref 70–99)
POTASSIUM: 4.5 meq/L (ref 3.5–5.1)
SODIUM: 134 meq/L — AB (ref 135–145)
TOTAL PROTEIN: 7.2 g/dL (ref 6.0–8.3)
Total Bilirubin: 0.6 mg/dL (ref 0.2–1.2)

## 2013-11-04 LAB — HEMOGLOBIN A1C: Hgb A1c MFr Bld: 9.4 % — ABNORMAL HIGH (ref 4.6–6.5)

## 2013-11-04 NOTE — Assessment & Plan Note (Signed)
Per daughter's request today, referral ordered for Dr. Denna Haggard at [facility] in [place].

## 2013-11-04 NOTE — Progress Notes (Signed)
Pre visit review using our clinic review tool, if applicable. No additional management support is needed unless otherwise documented below in the visit note. 

## 2013-11-04 NOTE — Assessment & Plan Note (Signed)
Has prostate seed implants. Gets approp urologic f/u and PSA monitoring---this has been in remission for years. Most recent PSA in chart is from [facility] dated 02/2013 and it was 0.04.

## 2013-11-04 NOTE — Telephone Encounter (Signed)
Letter printed.

## 2013-11-04 NOTE — Assessment & Plan Note (Signed)
The current medical regimen is effective;  continue present plan and medications. Lytes/cr today. 

## 2013-11-04 NOTE — Assessment & Plan Note (Signed)
Not assess today. Per daughter's request today, I ordered neurology referral for further eval of this problem (Dr. Brett Fairy).

## 2013-11-04 NOTE — Assessment & Plan Note (Signed)
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 Will see if I can review retinal specialist's notes to see if any diabetic retinopathy has been detected.

## 2013-11-04 NOTE — Assessment & Plan Note (Signed)
Hx of noncompliance with meds, no home glucose monitoring. Likely not following diabetic diet at all. Will recheck HbA1c today: goal A1c for him 7-8 range. He has known mac degen with hx of retinal detachment/bleeding, so his risk of aspirin 81mg  qd outweighs benefit. Continue ARB and statin. Will do complete diabetic foot exam next f/u visit in 2 wks, will also refer to podiatry for foot care at that time.

## 2013-11-04 NOTE — Telephone Encounter (Signed)
Patients daughter called in requesting letter in regards to patients competency for court on 11/12/13. Daughter states this was discussed with Dr. Anitra Lauth that patient wants Olin Hauser listed as guardian

## 2013-11-04 NOTE — Progress Notes (Signed)
Office Note 11/04/2013  CC:  Chief Complaint  Patient presents with  . Establish Care    HPI:  Randy Cohen is a 80 y.o. White male who is here to establish care--transfer from Dr. Lenna Gilford. Here with daughter Randy Cohen.  Daughter asks for referral to neurologist for pt today due to memory problems--requests Dr. Jeannetta Ellis who is the MD that sees his wife. No behavioral problems.   Daughter also asks that he be referred to Dr. Denna Haggard at [facility] for full skin exam.  Old records in EPIC/HL were reviewed prior to or during today's visit.  Past Medical History  Diagnosis Date  . Macular degeneration of left eye   . Allergic rhinitis   . Hypertension   . Diabetes mellitus   . Hiatal hernia   . Diverticulosis of colon   . Benign prostatic hypertrophy   . Carcinoma of prostate   . DJD (degenerative joint disease)   . History of gout   . Anxiety   . Glaucoma of left eye     Past Surgical History  Procedure Laterality Date  . 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 Appendectomy    . Tonsillectomy    . Right knee surgery    . Left shoulder surgery    . Left cataract surgery  10/2008    Dr. Charise Killian  . Nasal sinus surgery  remote past    for sinus polyps    No family history on file.  History   Social History  . Marital Status: Married    Spouse Name: Randy Cohen    Number of Children: N/A  . Years of Education: N/A   Occupational History  . Not on file.   Social History Main Topics  . Smoking status: Former Smoker -- 3.00 packs/day for 25 years    Types: Cigarettes    Quit date: 05/07/1958  . Smokeless tobacco: Never Used  . Alcohol Use: No  . Drug Use: No  . Sexual Activity: Not on file   Other Topics Concern  . Not on file   Social History Narrative   Married, 6 children.  Daughter Randy Cohen living with him currently.  Pt's wife has alzheimers dz and is in an ALF now.   Orig from [place].   Quit smoking around age  42.  No alcohol.   He drives with someone accompanying him.    Outpatient Encounter Prescriptions as of 11/04/2013  Medication Sig  . atropine 1 % ophthalmic solution Place 1 drop into the left eye daily.  . Difluprednate (DUREZOL) 0.05 % EMUL 1 drop into left eye twice daily  . diltiazem (CARDIZEM) 120 MG tablet Take 1 tablet (120 mg total) by mouth daily.  . metFORMIN (GLUCOPHAGE) 500 MG tablet TAKE 1 TABLET BY MOUTH once  A DAY WITH MEAL  . Multiple Vitamins-Minerals (PRESERVISION/LUTEIN) CAPS Take 1 capsule by mouth daily.    . simvastatin (ZOCOR) 20 MG tablet TAKE 1 TABLET BY MOUTH AT BEDTIME  . valsartan (DIOVAN) 80 MG tablet Take 1 tablet (80 mg total) by mouth daily.  . [DISCONTINUED] aspirin 81 MG tablet Take 81 mg by mouth daily.  . [DISCONTINUED] tobramycin (TOBREX) 0.3 % ophthalmic ointment 1 drop in both eyes every 4 hours    Allergies  Allergen Reactions  . Atenolol     REACTION: INTOL Atenolol w/ bradycardia  . Codeine Phosphate     REACTION: nausea    ROS Review of Systems  Constitutional: Negative for fever and fatigue.  HENT: Negative for congestion and sore throat.   Eyes: Negative for pain.  Respiratory: Negative for cough.   Cardiovascular: Negative for chest pain.  Gastrointestinal: Negative for nausea and abdominal pain.  Genitourinary: Negative for dysuria.  Musculoskeletal: Negative for back pain and joint swelling.  Skin: Negative for rash.  Neurological: Negative for weakness and headaches.  Hematological: Negative for adenopathy.    PE; Blood pressure 114/73, pulse 96, temperature 98.6 F (37 C), temperature source Temporal, resp. rate 16, height 5\' 8"  (1.727 m), weight 146 lb (66.225 kg), SpO2 98.00%. Gen: Alert, well appearing.  Patient is oriented to person, place, time, and situation. NFA:OZHY: no injection, icteris, swelling, or exudate.  EOMI.  Left pupil without red reflex, fixed and dilated, right pupil with normal RR, reactive to light.    Mouth: lips without lesion/swelling.  Oral mucosa pink and moist. Oropharynx without erythema, exudate, or swelling.  CV: RRR, no m/r/g.   LUNGS: CTA bilat, nonlabored resps, good aeration in all lung fields. EXT: no clubbing, cyanosis, or edema.  Toenails long and thick/deformed on both feet.  Nails are essentially eroded on left hand, normal on right hand.  Pertinent labs:  None today Recent labs:  Lab Results  Component Value Date   HGBA1C 8.1* 01/27/2013   Lab Results  Component Value Date   WBC 6.2 01/27/2013   HGB 14.3 01/27/2013   HCT 41.8 01/27/2013   MCV 91.8 01/27/2013   PLT 236.0 01/27/2013     Chemistry      Component Value Date/Time   NA 137 01/27/2013 1258   K 3.9 01/27/2013 1258   CL 98 01/27/2013 1258   CO2 31 01/27/2013 1258   BUN 16 01/27/2013 1258   CREATININE 1.0 01/27/2013 1258      Component Value Date/Time   CALCIUM 9.4 01/27/2013 1258   ALKPHOS 67 01/27/2013 1258   AST 20 01/27/2013 1258   ALT 13 01/27/2013 1258   BILITOT 0.7 01/27/2013 1258     Lab Results  Component Value Date   TSH 1.62 01/27/2013    ASSESSMENT AND PLAN:   Transfer pt:  Type II or unspecified type diabetes mellitus without mention of complication, uncontrolled Hx of noncompliance with meds, no home glucose monitoring. Likely not following diabetic diet at all. Will recheck HbA1c today: goal A1c for him 7-8 range. He has known mac degen with hx of retinal detachment/bleeding, so his risk of aspirin 81mg  qd outweighs benefit. Continue ARB and statin. Will do complete diabetic foot exam next f/u visit in 2 wks, will also refer to podiatry for foot care at that time.  HYPERTENSION The current medical regimen is effective;  continue present plan and medications. Lytes/cr today.  HYPERCHOLESTEROLEMIA Lab Results  Component Value Date   CHOL 234* 01/27/2013   HDL 47.70 01/27/2013   LDLCALC 91 11/02/2010   LDLDIRECT 160.0 01/27/2013   TRIG 171.0* 01/27/2013   CHOLHDL 5 01/27/2013    Not fasting today. Continue zocor 20mg  qd and recheck FLP in 3 months.  AST and ALT today.  CARCINOMA, PROSTATE Has prostate seed implants. Gets approp urologic f/u and PSA monitoring---this has been in remission for years. Most recent PSA in chart is from [facility] dated 02/2013 and it was 0.04.  Memory loss Not assess today. Per daughter's request today, I ordered neurology referral for further eval of this problem (Dr. Brett Fairy).  Skin exam, screening for cancer Per daughter's request today, referral ordered  for Dr. Denna Haggard at [facility] in [place].  UNSPECIFIED VISUAL LOSS Left eye only: macular degeneration with history of retinal bleeding/detachment, + glaucoma in same eye. Will see if I can review retinal specialist's notes to see if any diabetic retinopathy has been detected.  An After Visit Summary was printed and given to the patient.  Return in about 2 weeks (around 11/18/2013) for routine chronic illness f/u (30 min visit).

## 2013-11-04 NOTE — Assessment & Plan Note (Signed)
Lab Results  Component Value Date   CHOL 234* 01/27/2013   HDL 47.70 01/27/2013   LDLCALC 91 11/02/2010   LDLDIRECT 160.0 01/27/2013   TRIG 171.0* 01/27/2013   CHOLHDL 5 01/27/2013   Not fasting today. Continue zocor 20mg  qd and recheck FLP in 3 months.  AST and ALT today.

## 2013-11-05 ENCOUNTER — Ambulatory Visit (INDEPENDENT_AMBULATORY_CARE_PROVIDER_SITE_OTHER): Payer: Self-pay | Admitting: Ophthalmology

## 2013-11-05 ENCOUNTER — Telehealth: Payer: Self-pay | Admitting: Family Medicine

## 2013-11-05 ENCOUNTER — Other Ambulatory Visit: Payer: Self-pay | Admitting: Family Medicine

## 2013-11-05 MED ORDER — METFORMIN HCL 1000 MG PO TABS: 1000.0000 mg | ORAL_TABLET | Freq: Two times a day (BID) | ORAL | Status: DC

## 2013-11-05 NOTE — Telephone Encounter (Signed)
Relevant patient education mailed to patient.  

## 2013-11-09 NOTE — Telephone Encounter (Signed)
Please advise 

## 2013-11-09 NOTE — Telephone Encounter (Signed)
LMOVM for daughter to return call.

## 2013-11-09 NOTE — Telephone Encounter (Signed)
Have pt take 1/2 of his 1000 mg metformin twice a day for a week and see if this helps.

## 2013-11-09 NOTE — Telephone Encounter (Signed)
Patient has seemed "out of it" since he has increased his dosage of "sugar medicine". Patient's daughter isn't sure if he could be battling depression or if he reacting to the medication dosage. He is having trouble with adjusting to his wife's alzheimers. The facility she is at is limiting the time he can visit her.

## 2013-11-10 NOTE — Telephone Encounter (Signed)
Fasting not necessary.-thx

## 2013-11-10 NOTE — Telephone Encounter (Signed)
Patient's daughter, Olin Hauser aware.

## 2013-11-10 NOTE — Telephone Encounter (Signed)
Patient's daughter returned call and was informed of dose change. Daughter rescheduled appt for afternoon. Does pt need a fasting lab?

## 2013-11-11 ENCOUNTER — Encounter: Payer: Self-pay | Admitting: Family Medicine

## 2013-11-16 ENCOUNTER — Other Ambulatory Visit: Payer: Self-pay | Admitting: Family Medicine

## 2013-11-16 DIAGNOSIS — E119 Type 2 diabetes mellitus without complications: Secondary | ICD-10-CM

## 2013-11-19 ENCOUNTER — Encounter: Payer: Self-pay | Admitting: Family Medicine

## 2013-11-19 ENCOUNTER — Ambulatory Visit (INDEPENDENT_AMBULATORY_CARE_PROVIDER_SITE_OTHER): Payer: Medicare Other | Admitting: Family Medicine

## 2013-11-19 ENCOUNTER — Ambulatory Visit: Payer: Medicare Other | Admitting: Family Medicine

## 2013-11-19 VITALS — BP 110/66 | HR 78 | Temp 98.2°F | Ht 68.0 in

## 2013-11-19 DIAGNOSIS — R29898 Other symptoms and signs involving the musculoskeletal system: Secondary | ICD-10-CM

## 2013-11-19 DIAGNOSIS — E1165 Type 2 diabetes mellitus with hyperglycemia: Principal | ICD-10-CM

## 2013-11-19 DIAGNOSIS — IMO0001 Reserved for inherently not codable concepts without codable children: Secondary | ICD-10-CM

## 2013-11-19 DIAGNOSIS — Z23 Encounter for immunization: Secondary | ICD-10-CM

## 2013-11-19 DIAGNOSIS — E7800 Pure hypercholesterolemia, unspecified: Secondary | ICD-10-CM

## 2013-11-19 LAB — MICROALBUMIN / CREATININE URINE RATIO
Creatinine,U: 188.5 mg/dL
Microalb Creat Ratio: 0.7 mg/g (ref 0.0–30.0)
Microalb, Ur: 1.4 mg/dL (ref 0.0–1.9)

## 2013-11-19 MED ORDER — METFORMIN HCL 1000 MG PO TABS: ORAL_TABLET | ORAL | Status: DC

## 2013-11-19 NOTE — Progress Notes (Signed)
Pre visit review using our clinic review tool, if applicable. No additional management support is needed unless otherwise documented below in the visit note. 

## 2013-11-19 NOTE — Progress Notes (Signed)
OFFICE NOTE  11/19/2013  CC:  Chief Complaint  Patient presents with  . Follow-up   HPI: Patient is a 80 y.o. Caucasian male who is here for 2 week f/u DM 2. Increased glucophage to 1000 mg bid after last visit b/c A1c was 9.4%.  He does not check his glucose at home. Daughter is Daughter felt like he had some mood changes after this so I advised him to decrease dose back to 500 mg bid to see if he felt better.  He did seem to act better mentally but they went ahead and restarted the 1000 mg bid dosing in the last couple of days.  It turns out he had likely been off of his meds completely for a few months prior to my visit with him 2 wks ago.  Referred pt to neurologist last visit due to memory problems and he has this appt set for tomorrow.  Daughter wonders about his statin being the cause of his leg weakness (as well as possibly the cognitive/memory problems).  It is not clear that these sx's began after getting on this med or after increase in dose of the med. Problem is, as stated earlier, he seems to have taken his meds inconsistently if at all in the last 6 mo. He complains of no pain.  No fevers.  NO cough.  NO SOB or CP.  No HA's or dizziness.  Pertinent PMH:  Past Medical History  Diagnosis Date  . Macular degeneration of left eye   . Allergic rhinitis   . Hypertension   . Diabetes mellitus   . Hiatal hernia   . Diverticulosis of colon   . Benign prostatic hypertrophy   . Carcinoma of prostate   . DJD (degenerative joint disease)   . History of gout   . Anxiety   . Glaucoma of left eye   . Hyphema of left eye 06/2012  . History of peptic ulcer   . Hyperlipidemia    Past Surgical History  Procedure Laterality Date  . 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 Appendectomy    . Tonsillectomy and adenoidectomy    . Right knee surgery      Laceration from a saw  . Left shoulder surgery      shoulder replacement  . Left cataract surgery  10/2008     Dr. Charise Killian  . Nasal sinus surgery  remote past    for sinus polyps  . Cardiovascular stress test  2005    Normal  . Colonoscopy  2007    diverticulosis  . Skin cancer excision  2011    Basal cell    MEDS:  Outpatient Prescriptions Prior to Visit  Medication Sig Dispense Refill  . atropine 1 % ophthalmic solution Place 1 drop into the left eye daily.      . Difluprednate (DUREZOL) 0.05 % EMUL 1 drop into left eye twice daily      . diltiazem (CARDIZEM) 120 MG tablet Take 1 tablet (120 mg total) by mouth daily.  30 tablet  6  . metFORMIN (GLUCOPHAGE) 1000 MG tablet Take 1 tablet (1,000 mg total) by mouth 2 (two) times daily with a meal.  60 tablet  3  . Multiple Vitamins-Minerals (PRESERVISION/LUTEIN) CAPS Take 1 capsule by mouth daily.        . simvastatin (ZOCOR) 20 MG tablet TAKE 1 TABLET BY MOUTH AT BEDTIME  30 tablet  6  . valsartan (DIOVAN) 80 MG tablet Take  1 tablet (80 mg total) by mouth daily.  30 tablet  6   No facility-administered medications prior to visit.    PE: Blood pressure 110/66, pulse 78, temperature 98.2 F (36.8 C), temperature source Temporal, height 5\' 8"  (1.727 m), SpO2 98.00%. Gen: Alert, well appearing.   AFFECT: pleasant, lucid thought and speech. FEET: Foot exam - both deformity no swelling, no tenderness of skin.  Some diffuse, noninflamed varicosities are scattered about the LE's.  No edema.  Color and temperature is normal. Sensation is intact. Peripheral pulses are palpable. Toenails are thickened and long.  LAB: none today  IMPRESSION AND PLAN:  1) DM 2, poor control, medical noncompliance. Back on metformin now and we'll keep him at 500mg  bid dosing, recheck A1c in 3 mo. Daughters will make effort in near future to learn how to use his glucometer to check his sugar occasionally at home. Foot exam normal today except nails: has appt with podiatrist soon. Urine microalb/cr today. Prevnar 13 IM today.  2) Generalized LE weakness: question of  med effect (simvastatin). I think the best thing to do at this time is to stop the simvastatin for a few months and see how his legs feel.  3) Memory problems: has neurology consult #1 tomorrow.  FOLLOW UP:  59mo, fasting--f/u DM 2, HTN, hyperlip

## 2013-11-20 ENCOUNTER — Ambulatory Visit (INDEPENDENT_AMBULATORY_CARE_PROVIDER_SITE_OTHER): Payer: Medicare Other | Admitting: Neurology

## 2013-11-20 ENCOUNTER — Encounter: Payer: Self-pay | Admitting: Neurology

## 2013-11-20 VITALS — BP 103/60 | HR 73 | Resp 18

## 2013-11-20 DIAGNOSIS — R413 Other amnesia: Secondary | ICD-10-CM | POA: Insufficient documentation

## 2013-11-20 NOTE — Progress Notes (Signed)
[facility]  Provider:  Larey Cohen, M D  Referring Provider: Tammi Sou, MD Primary Care Physician:  Randy Sou, MD  Chief Complaint  Patient presents with  . New Evaluation    Room 10  . Memory Loss    MMSE 18/30   Aft- 10    HPI:  Randy Cohen is a 80 y.o. male, who  is seen here as a referral from Dr. Anitra Cohen for a memory loss evaluation :  Mr. Randy Cohen wife has been a patient at [facility] and suffered from progressive dementia. She has 6 weeks ago been committed to a special home.  She had been placed in a home, but her husband "signed her out and let her drive ". While taking care of her father, their daughter has noted significant memory troubles in him, too. He was considered the main caregiver. 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 He retired in 2004, but answered me, that is was 6 years ago. His daughter feels his memory is slowly declining, not step wise. He has multiple vascular risk factors.   He has been given the medications he needs and regular meals through his daughter, who stays now with him. He has diabetes, but went to [employer] and [employer] for Lunch , not cooking , mostly  fast food. His diet has changed now under his daughters guidance. He is not longer driving, upon his daughters insistence.  He became confused after his diabetic medication , metformin , was increased from 500 mg to 2000 mg daily.   MMSE 18 out of 30 , disoriented to to date , place and unable to recall 3 words. GDS 1 points.    He carries a diagnosis of hypertension, hypercholesterolemia, prostate cancer, diabetes currently classified as uncontrolled. He has had shoulder surgery on the left, knee surgery on the right, up and active he, tonsillectomy, seed implantation to the prostate. He had nasal polyps removed, cataract surgery, he carries a diagnosis of  left eye Glaucoma, Gout  , DJD, hiatal hernia.  There is no family history of dementia.    Review of Systems: Out of a complete 14 system review, the patient complains of only the following symptoms, and all other reviewed systems are negative. GDS 1 , MMSE 18, Epworth 7 points.    History   Social History  . Marital Status: Married    Spouse Name: Randy Cohen    Number of Children: 6  . Years of Education: 8   Occupational History  . Not on file.   Social History Main Topics  . Smoking status: Former Smoker -- 3.00 packs/day for 25 years    Types: Cigarettes    Quit date: 05/07/1958  . Smokeless tobacco: Never Used  . Alcohol Use: No  . Drug Use: No  . Sexual Activity: Not on file   Other Topics Concern  . Not on file   Social History Narrative   Married Randy Cohen) , 6 children.  Daughter Randy Cohen living with him currently.  Pt's wife has alzheimers dz and is in an ALF now.   Orig from [place].   Quit smoking around age 36.  No alcohol.   He drives with someone accompanying him.   Patient is retired.   Patient has a 8th grade education.   Patient is right-handed.   Patient drinks two cups of coffee daily, one Pepsi/tea per day.  Family History  Problem Relation Age of Onset  . Colon cancer Brother   . Macular degeneration Brother   . Heart Problems Brother     pacemaker  . Breast cancer Sister     Past Medical History  Diagnosis Date  . Macular degeneration of left eye   . Allergic rhinitis   . Hypertension   . Diabetes mellitus   . Hiatal hernia   . Diverticulosis of colon   . Benign prostatic hypertrophy   . Carcinoma of prostate   . DJD (degenerative joint disease)   . History of gout   . Anxiety   . Glaucoma of left eye   . Hyphema of left eye 06/2012  . History of peptic ulcer   . Hyperlipidemia   . Dementia arising in the senium and presenium     Past Surgical History  Procedure Laterality Date  . 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  Appendectomy    . Tonsillectomy and adenoidectomy    . Right knee surgery      Laceration from a saw  . Left shoulder surgery      shoulder replacement  . Left cataract surgery  10/2008    Dr. Charise Cohen  . Nasal sinus surgery  remote past    for sinus polyps  . Cardiovascular stress test  2005    Normal  . Colonoscopy  2007    diverticulosis  . Skin cancer excision  2011    Basal cell    Current Outpatient Prescriptions  Medication Sig Dispense Refill  . atropine 1 % ophthalmic solution Place 1 drop into the left eye daily.      . Difluprednate (DUREZOL) 0.05 % EMUL 1 drop into left eye twice daily      . diltiazem (CARDIZEM) 120 MG tablet Take 1 tablet (120 mg total) by mouth daily.  30 tablet  6  . metFORMIN (GLUCOPHAGE) 1000 MG tablet 1/2 tab po bid  60 tablet  3  . Multiple Vitamins-Minerals (PRESERVISION/LUTEIN) CAPS Take 1 capsule by mouth daily.        . valsartan (DIOVAN) 80 MG tablet Take 1 tablet (80 mg total) by mouth daily.  30 tablet  6   No current facility-administered medications for this visit.    Allergies as of 11/20/2013 - Review Complete 11/20/2013  Allergen Reaction Noted  . Atenolol    . Codeine phosphate      Vitals: BP 103/60  Pulse 73  Resp 18 Last Weight:  Wt Readings from Last 1 Encounters:  11/04/13 146 lb (66.225 kg)   Last Height:   Ht Readings from Last 1 Encounters:  11/19/13 5\' 8"  (1.727 m)    Physical exam:  General: The patient is awake, alert and appears not in acute distress. The patient is well groomed. Head: Normocephalic, atraumatic. Neck is supple. Mallampati 2 , neck circumference:  Cardiovascular:  Regular rate and rhythm, without  murmurs or carotid bruit, and without distended neck veins. Respiratory: Lungs are clear to auscultation. Skin:  Without evidence of edema, or rash Trunk: Patient has normal posture.  Neurologic exam : The patient is awake and alert, oriented to place and time.  Memory subjective  described as  intact- he is not aware of the deficits.  There is a limited  attention span & concentration ability.  Speech is non fluent without  dysarthria, dysphonia or aphasia. Mood and affect are appropriate.  Cranial nerves: Left pupil dilated , un-reactive  to light .  Funduscopic exam documented no red reflex. His right eye has a cataract, the retina is well perfused , i cannot appreciate all vessels. The patient did not blink to visual threat !   Extraocular movements  in vertical and horizontal planes intact and without nystagmus. Visual fields by finger perimetry are intact. Hearing to finger rub intact.  Facial sensation intact to fine touch. Facial motor strength is symmetric and tongue and uvula move midline.  Motor exam:   Normal tone, muscle bulk and symmetric  strength in all extremities.  Sensory:  Fine touch, pinprick and vibration were tested in all extremities.  Proprioception is normal.  Coordination: Rapid alternating movements in the fingers/hands is tested and normal.  Finger-to-nose maneuver tested and normal without evidence of ataxia, dysmetria or tremor.  Gait and station: Patient walks without assistive device - Strength within normal limits. Stance is stable and normal.  Romberg testing is retro-pulsive. Deep tendon reflexes: in the  upper and lower extremities are symmetric and intact. Babinski maneuver response is downgoing.   Assessment:  After physical and neurologic examination, review of laboratory studies, imaging, neurophysiology testing and pre-existing records, assessment is:  1) please have right eye vision re-evaluated- our primitive  [facility]NA  vision test was 20-100 , and last month eye exam with dr Zigmund Daniel was 20/30 . That's a rapid change. 2) progressive dementia- small vessel disease is a contributing fact. 18-30 points on MMSE, he has dyslexia.  3) upgoing toes ( Babinski ) a sign of white matter UMN disease.      Plan:  Treatment plan and additional workup  :  MRI brain , non contrast in diabetes.

## 2013-11-20 NOTE — Patient Instructions (Signed)
Dementia Dementia is a general term for problems with brain function. A person with dementia has memory loss and a hard time with at least one other brain function such as thinking, speaking, or problem solving. Dementia can affect social functioning, how you do your job, your mood, or your personality. The changes may be hidden for a long time. The earliest forms of this disease are usually not detected by family or friends. Dementia can be:  Irreversible.  Potentially reversible.  Partially reversible.  Progressive. This means it can get worse over time. CAUSES  Irreversible dementia causes may include:  Degeneration of brain cells (Alzheimer's disease or lewy body dementia).  Multiple small strokes (vascular dementia).  Infection (chronic meningitis or Creutzfelt-Jakob disease).  Frontotemporal dementia. This affects younger people, age 40 to 70, compared to those who have Alzheimer's disease.  Dementia associated with other disorders like Parkinson's disease, Huntington's disease, or HIV-associated dementia. Potentially or partially reversible dementia causes may include:  Medicines.  Metabolic causes such as excessive alcohol intake, vitamin B12 deficiency, or thyroid disease.  Masses or pressure in the brain such as a tumor, blood clot, or hydrocephalus. SYMPTOMS  Symptoms are often hard to detect. Family members or coworkers may not notice them early in the disease process. Different people with dementia may have different symptoms. Symptoms can include:  A hard time with memory, especially recent memory. Long-term memory may not be impaired.  Asking the same question multiple times or forgetting something someone just said.  A hard time speaking your thoughts or finding certain words.  A hard time solving problems or performing familiar tasks (such as how to use a telephone).  Sudden changes in mood.  Changes in personality, especially increasing moodiness or  mistrust.  Depression.  A hard time understanding complex ideas that were never a problem in the past. DIAGNOSIS  There are no specific tests for dementia.   Your caregiver may recommend a thorough evaluation. This is because some forms of dementia can be reversible. The evaluation will likely include a physical exam and getting a detailed history from you and a family member. The history often gives the best clues and suggestions for a diagnosis.  Memory testing may be done. A detailed brain function evaluation called neuropsychologic testing may be helpful.  Lab tests and brain imaging (such as a CT scan or MRI scan) are sometimes important.  Sometimes observation and re-evaluation over time is very helpful. TREATMENT  Treatment depends on the cause.   If the problem is a vitamin deficiency, it may be helped or cured with supplements.  For dementias such as Alzheimer's disease, medicines are available to stabilize or slow the course of the disease. There are no cures for this type of dementia.  Your caregiver can help direct you to groups, organizations, and other caregivers to help with decisions in the care of you or your loved one. HOME CARE INSTRUCTIONS The care of individuals with dementia is varied and dependent upon the progression of the dementia. The following suggestions are intended for the person living with, or caring for, the person with dementia.  Create a safe environment.  Remove the locks on bathroom doors to prevent the person from accidentally locking himself or herself in.  Use childproof latches on kitchen cabinets and any place where cleaning supplies, chemicals, or alcohol are kept.  Use childproof covers in unused electrical outlets.  Install childproof devices to keep doors and windows secured.  Remove stove knobs or install safety   knobs and an automatic shut-off on the stove.  Lower the temperature on water heaters.  Label medicines and keep them  locked up.  Secure knives, lighters, matches, power tools, and guns, and keep these items out of reach.  Keep the house free from clutter. Remove rugs or anything that might contribute to a fall.  Remove objects that might break and hurt the person.  Make sure lighting is good, both inside and outside.  Install grab rails as needed.  Use a monitoring device to alert you to falls or other needs for help.  Reduce confusion.  Keep familiar objects and people around.  Use night lights or dim lights at night.  Label items or areas.  Use reminders, notes, or directions for daily activities or tasks.  Keep a simple, consistent routine for waking, meals, bathing, dressing, and bedtime.  Create a calm, quiet environment.  Place large clocks and calendars prominently.  Display emergency numbers and home address near all telephones.  Use cues to establish different times of the day. An example is to open curtains to let the natural light in during the day.   Use effective communication.  Choose simple words and short sentences.  Use a gentle, calm tone of voice.  Be careful not to interrupt.  If the person is struggling to find a word or communicate a thought, try to provide the word or thought.  Ask one question at a time. Allow the person ample time to answer questions. Repeat the question again if the person does not respond.  Reduce nighttime restlessness.  Provide a comfortable bed.  Have a consistent nighttime routine.  Ensure a regular walking or physical activity schedule. Involve the person in daily activities as much as possible.  Limit napping during the day.  Limit caffeine.  Attend social events that stimulate rather than overwhelm the senses.  Encourage good nutrition and hydration.  Reduce distractions during meal times and snacks.  Avoid foods that are too hot or too cold.  Monitor chewing and swallowing ability.  Continue with routine vision,  hearing, dental, and medical screenings.  Only give over-the-counter or prescription medicines as directed by the caregiver.  Monitor driving abilities. Do not allow the person to drive when safe driving is no longer possible.  Register with an identification program which could provide location assistance in the event of a missing person situation. SEEK MEDICAL CARE IF:   New behavioral problems start such as moodiness, aggressiveness, or seeing things that are not there (hallucinations).  Any new problem with brain function happens. This includes problems with balance, speech, or falling a lot.  Problems with swallowing develop.  Any symptoms of other illness happen. Small changes or worsening in any aspect of brain function can be a sign that the illness is getting worse. It can also be a sign of another medical illness such as infection. Seeing a caregiver right away is important. SEEK IMMEDIATE MEDICAL CARE IF:   A fever develops.  New or worsened confusion develops.  New or worsened sleepiness develops.  Staying awake becomes hard to do. Document Released: 10/17/2000 Document Revised: 07/16/2011 Document Reviewed: 09/18/2010 [employer] Patient Information 2015 [employer]. This information is not intended to replace advice given to you by your health care provider. Make sure you discuss any questions you have with your health care provider.  

## 2013-11-25 ENCOUNTER — Encounter: Payer: Self-pay | Admitting: Family Medicine

## 2013-11-26 ENCOUNTER — Ambulatory Visit
Admission: RE | Admit: 2013-11-26 | Discharge: 2013-11-26 | Disposition: A | Discharge status: Home / Self Care | Payer: Medicare Other | Source: Ambulatory Visit | Attending: Neurology | Admitting: Neurology

## 2013-11-26 DIAGNOSIS — R413 Other amnesia: Secondary | ICD-10-CM

## 2013-11-30 NOTE — Progress Notes (Signed)
Quick Note:  Spoke to patient's daughters Olin Hauser and [place] and relayed MRI brain results, per Dr. Brett Fairy. They will follow up with PCP about nasal cavity, severe mucous retention and with further questions at patient's follow up visit in August. ______

## 2013-12-02 ENCOUNTER — Encounter: Payer: Self-pay | Admitting: Podiatry

## 2013-12-02 ENCOUNTER — Ambulatory Visit (INDEPENDENT_AMBULATORY_CARE_PROVIDER_SITE_OTHER): Payer: Medicare Other | Admitting: Podiatry

## 2013-12-02 VITALS — BP 140/62 | HR 73 | Resp 16 | Ht 68.0 in | Wt 160.0 lb

## 2013-12-02 DIAGNOSIS — B351 Tinea unguium: Secondary | ICD-10-CM

## 2013-12-02 DIAGNOSIS — M79609 Pain in unspecified limb: Secondary | ICD-10-CM

## 2013-12-02 DIAGNOSIS — M79676 Pain in unspecified toe(s): Secondary | ICD-10-CM

## 2013-12-02 DIAGNOSIS — R0989 Other specified symptoms and signs involving the circulatory and respiratory systems: Secondary | ICD-10-CM

## 2013-12-02 NOTE — Patient Instructions (Signed)
The vascular lab we'll contact you to schedule a lower extremity arterial Doppler  Diabetes and Foot Care Diabetes may cause you to have problems because of poor blood supply (circulation) to your feet and legs. This may cause the skin on your feet to become thinner, break easier, and heal more slowly. Your skin may become dry, and the skin may peel and crack. You may also have nerve damage in your legs and feet causing decreased feeling in them. You may not notice minor injuries to your feet that could lead to infections or more serious problems. Taking care of your feet is one of the most important things you can do for yourself.  HOME CARE INSTRUCTIONS  Wear shoes at all times, even in the house. Do not go barefoot. Bare feet are easily injured.  Check your feet daily for blisters, cuts, and redness. If you cannot see the bottom of your feet, use a mirror or ask someone for help.  Wash your feet with warm water (do not use hot water) and mild soap. Then pat your feet and the areas between your toes until they are completely dry. Do not soak your feet as this can dry your skin.  Apply a moisturizing lotion or petroleum jelly (that does not contain alcohol and is unscented) to the skin on your feet and to dry, brittle toenails. Do not apply lotion between your toes.  Trim your toenails straight across. Do not dig under them or around the cuticle. File the edges of your nails with an emery board or nail file.  Do not cut corns or calluses or try to remove them with medicine.  Wear clean socks or stockings every day. Make sure they are not too tight. Do not wear knee-high stockings since they may decrease blood flow to your legs.  Wear shoes that fit properly and have enough cushioning. To break in new shoes, wear them for just a few hours a day. This prevents you from injuring your feet. Always look in your shoes before you put them on to be sure there are no objects inside.  Do not cross your  legs. This may decrease the blood flow to your feet.  If you find a minor scrape, cut, or break in the skin on your feet, keep it and the skin around it clean and dry. These areas may be cleansed with mild soap and water. Do not cleanse the area with peroxide, alcohol, or iodine.  When you remove an adhesive bandage, be sure not to damage the skin around it.  If you have a wound, look at it several times a day to make sure it is healing.  Do not use heating pads or hot water bottles. They may burn your skin. If you have lost feeling in your feet or legs, you may not know it is happening until it is too late.  Make sure your health care provider performs a complete foot exam at least annually or more often if you have foot problems. Report any cuts, sores, or bruises to your health care provider immediately. SEEK MEDICAL CARE IF:   You have an injury that is not healing.  You have cuts or breaks in the skin.  You have an ingrown nail.  You notice redness on your legs or feet.  You feel burning or tingling in your legs or feet.  You have pain or cramps in your legs and feet.  Your legs or feet are numb.  Your feet  always feel cold. SEEK IMMEDIATE MEDICAL CARE IF:   There is increasing redness, swelling, or pain in or around a wound.  There is a red line that goes up your leg.  Pus is coming from a wound.  You develop a fever or as directed by your health care provider.  You notice a bad smell coming from an ulcer or wound. Document Released: 04/20/2000 Document Revised: 12/24/2012 Document Reviewed: 09/30/2012 [facility] Patient Information 2015 [place], [place]. This information is not intended to replace advice given to you by your health care provider. Make sure you discuss any questions you have with your health care provider.

## 2013-12-02 NOTE — Progress Notes (Signed)
   Subjective:    Patient ID: Randy Cohen, male    DOB: 03/02/1932, 80 y.o.   MRN: [id number]  HPI Comments: N debridement L 10 toenails D and O long-term C elongated thick toenails A diabetes, and difficulty cutting T none     Review of Systems  All other systems reviewed and are negative.      Objective:   Physical Exam  Orientated x3 white male  Vascular: Right PT pulse 0/4 Left PT pulse 2/4 DP pulses 2/4 bilaterally  Neurological: Sensation to 10 g monofilament wire intact 4/5 right and 5/5 left Vibratory sensation nonreactive bilaterally Ankle reflexes reactive bilaterally  Dermatological: Hypertrophied, elongated, incurvated, discolored toenails 6-10  Musculoskeletal: Hammertoe deformity second bilaterally No restriction ankle, subtalar, midtarsal joints bilaterally       Assessment & Plan:   Assessment: Diminished pedal pulse right PT half Doppler evaluation Protective sensation intact bilaterally Symptomatic onychomycoses 6-10  Plan: Nails x10 are debrided without any bleeding Arrange for lower extremity arterial Doppler for the indication of diminished right posterior tibial pulse and notify patient of results.  Reappoint at 3 month intervals for nail debridement

## 2013-12-03 ENCOUNTER — Telehealth (HOSPITAL_COMMUNITY): Payer: Self-pay | Admitting: *Deleted

## 2013-12-03 ENCOUNTER — Encounter: Payer: Self-pay | Admitting: Podiatry

## 2013-12-05 HISTORY — PX: OTHER SURGICAL HISTORY: SHX169

## 2013-12-11 ENCOUNTER — Ambulatory Visit (HOSPITAL_COMMUNITY)
Admission: RE | Admit: 2013-12-11 | Discharge: 2013-12-11 | Disposition: A | Discharge status: Home / Self Care | Payer: Medicare Other | Source: Ambulatory Visit | Attending: Cardiology | Admitting: Cardiology

## 2013-12-11 DIAGNOSIS — R0989 Other specified symptoms and signs involving the circulatory and respiratory systems: Secondary | ICD-10-CM | POA: Diagnosis present

## 2013-12-11 NOTE — Progress Notes (Signed)
Right Lower Extremity Arterial Duplex Completed. °Brianna L Mazza,RVT °

## 2013-12-23 ENCOUNTER — Telehealth: Payer: Self-pay | Admitting: *Deleted

## 2013-12-23 NOTE — Telephone Encounter (Signed)
Calling about his results from the vascular test that Dr. Amalia Hailey wanted him to go for.  I called and informed her that Dr. Amalia Hailey said the doppler was normal, no follow-up is needed.  She stated, good.  She asked if we could send the report to Dr. Marisue Brooklyn.  I told her to leave a message for Janett Billow in Medical Records because I am not sure if a release form needs to be signed or not.  She stated he is the one who referred him over here.  I transferred her to [place] so she could leave a message.

## 2014-01-01 ENCOUNTER — Telehealth: Payer: Self-pay | Admitting: *Deleted

## 2014-01-01 ENCOUNTER — Encounter: Payer: Self-pay | Admitting: Adult Health

## 2014-01-01 ENCOUNTER — Ambulatory Visit (INDEPENDENT_AMBULATORY_CARE_PROVIDER_SITE_OTHER): Payer: Medicare Other | Admitting: Adult Health

## 2014-01-01 VITALS — BP 132/57 | HR 73 | Ht 68.0 in | Wt 142.0 lb

## 2014-01-01 DIAGNOSIS — R413 Other amnesia: Secondary | ICD-10-CM

## 2014-01-01 MED ORDER — DONEPEZIL HCL 5 MG PO TABS: 5.0000 mg | ORAL_TABLET | Freq: Every day | ORAL | Status: DC

## 2014-01-01 NOTE — Telephone Encounter (Signed)
Spoke with patient's daughter Olin Hauser to ask if they could come in earlier than scheduled 3:30 visit, patient agreed will be in around 2 pm.

## 2014-01-01 NOTE — Progress Notes (Signed)
PATIENT: Randy Cohen DOB: 12-Jan-1932  REASON FOR VISIT: follow up HISTORY FROM: patient  HISTORY OF PRESENT ILLNESS: Mr. Randy Cohen is an 80 year old male with a history of progressive Dementia. He returns today for follow-up. He is not currently taking any medications for his memory. He reports that his memory has remained the same. Daughter states that he may have gotten a little better. Denies having to give up any activities due to his memory. He no longer operates a Teacher, music. He is able to complete all ADLs without difficulty. His daughter lives at home with him and helps with meals and finances. Daughter concerned because at another doctors appointment earlier this month he weighed 162 lbs and today he weighs 142 lbs. According to Dr. Edwena Felty last note he weighed 146 lbs. It is most likely due to the scales used at another office.   HISTORY 11/20/13 (CD): 80 y.o. male, who is seen here as a referral from Dr. Anitra Lauth for a memory loss evaluation :  Mr. Randy Cohen wife has been a patient at [facility] and suffered from progressive dementia. She has 6 weeks ago been committed to a special home.  She had been placed in a home, but her husband "signed her out and let her drive ".  While taking care of her father, their daughter has noted significant memory troubles in him, too. He was considered the main caregiver. 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  He retired in 2004, but answered me, that is was 6 years ago. His daughter feels his memory is slowly declining, not step wise. He has multiple vascular risk factors.  He has been given the medications he needs and regular meals through his daughter, who stays now with him. He has diabetes, but went to [employer] and [employer] for Lunch , not cooking , mostly fast food. His diet has changed now under his daughters guidance. He is not longer driving, upon his daughters  insistence.  He became confused after his diabetic medication , metformin , was increased from 500 mg to 2000 mg daily.  MMSE 18 out of 30 , disoriented to date , place and unable to recall 3 words. GDS 1 points.  He carries a diagnosis of hypertension, hypercholesterolemia, prostate cancer, diabetes currently classified as uncontrolled. He has had shoulder surgery on the left, knee surgery on the right, up and active he, tonsillectomy, seed implantation to the prostate. He had nasal polyps removed, cataract surgery, he carries a diagnosis of left eye Glaucoma, Gout , DJD, hiatal hernia.   REVIEW OF SYSTEMS: Full 14 system review of systems performed and notable only for:  Constitutional: N/A  Eyes: N/A Ear/Nose/Throat: N/A  Skin: N/A  Cardiovascular: N/A  Respiratory: N/A  Gastrointestinal: N/A  Genitourinary: N/A Hematology/Lymphatic: N/A  Endocrine: N/A Musculoskeletal:N/A  Allergy/Immunology: N/A  Neurological: N/A Psychiatric: N/A Sleep: N/A   ALLERGIES: Allergies  Allergen Reactions  . Atenolol     REACTION: INTOL Atenolol w/ bradycardia  . Codeine Phosphate     REACTION: nausea    HOME MEDICATIONS: Outpatient Prescriptions Prior to Visit  Medication Sig Dispense Refill  . Difluprednate (DUREZOL) 0.05 % EMUL 1 drop into left eye as needed      . diltiazem (CARDIZEM) 120 MG tablet Take 1 tablet (120 mg total) by mouth daily.  30 tablet  6  . metFORMIN (GLUCOPHAGE) 1000 MG tablet 1/2 tab  po bid  60 tablet  3  . Multiple Vitamins-Minerals (PRESERVISION/LUTEIN) CAPS Take 1 capsule by mouth daily.        . valsartan (DIOVAN) 80 MG tablet Take 1 tablet (80 mg total) by mouth daily.  30 tablet  6   No facility-administered medications prior to visit.    PAST MEDICAL HISTORY: Past Medical History  Diagnosis Date  . Macular degeneration of left eye   . Allergic rhinitis   . Hypertension   . Diabetes mellitus   . Hiatal hernia   . Diverticulosis of colon   . Benign  prostatic hypertrophy   . Carcinoma of prostate   . DJD (degenerative joint disease)   . History of gout   . Anxiety   . Glaucoma of left eye   . Hyphema of left eye 06/2012  . History of peptic ulcer   . Hyperlipidemia   . Dementia arising in the senium and presenium 2015    PAST SURGICAL HISTORY: Past Surgical History  Procedure Laterality Date  . 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 Appendectomy    . Tonsillectomy and adenoidectomy    . Right knee surgery      Laceration from a saw  . Left shoulder surgery      shoulder replacement  . Left cataract surgery  10/2008    Dr. Charise Killian  . Nasal sinus surgery  remote past    for sinus polyps  . Cardiovascular stress test  2005    Normal  . Colonoscopy  2007    diverticulosis  . Skin cancer excision  2011    Basal cell    FAMILY HISTORY: Family History  Problem Relation Age of Onset  . Colon cancer Brother   . Macular degeneration Brother   . Heart Problems Brother     pacemaker  . Breast cancer Sister     SOCIAL HISTORY: History   Social History  . Marital Status: Married    Spouse Name: Randy Cohen    Number of Children: 6  . Years of Education: 8   Occupational History  . Not on file.   Social History Main Topics  . Smoking status: Former Smoker -- 3.00 packs/day for 25 years    Types: Cigarettes    Quit date: 05/07/1958  . Smokeless tobacco: Never Used  . Alcohol Use: No  . Drug Use: No  . Sexual Activity: Not on file   Other Topics Concern  . Not on file   Social History Narrative   Married Randy Cohen) , 6 children.  Daughter Randy Cohen living with him currently.  Pt's wife has alzheimers dz and is in an ALF now.   Orig from [place].   Quit smoking around age 4.  No alcohol.   He drives with someone accompanying him.   Patient is retired.   Patient has a 8th grade education.   Patient is right-handed.   Patient drinks two cups of coffee daily, one Pepsi/tea per day.       PHYSICAL EXAM  Filed Vitals:   01/01/14 1357  BP: 132/57  Pulse: 73  Height: 5\' 8"  (1.727 m)  Weight: 142 lb (64.411 kg)   Body mass index is 21.6 kg/(m^2).  Generalized: Well developed, in no acute distress   Neurological examination  Mentation: Alert oriented to time, place, history taking. Follows all commands speech and language fluent Cranial nerve II-XII:  Extraocular movements were full, visual field were full on confrontational test.  Facial sensation and strength were normal. hearing was intact to finger rubbing bilaterally. Uvula tongue midline. Head turning and shoulder shrug  were normal and symmetric. Motor: The motor testing reveals 5 over 5 strength of all 4 extremities. Good symmetric motor tone is noted throughout.  Sensory: Sensory testing is intact to soft touch on all 4 extremities. No evidence of extinction is noted.  Coordination: Cerebellar testing reveals good finger-nose-finger and heel-to-shin bilaterally.  Gait and station: Gait is slightly wide based and unsteady. Tandem gait is unsteady. Romberg is negative. No drift is seen.  Reflexes: Deep tendon reflexes are symmetric and normal bilaterally.     DIAGNOSTIC DATA (LABS, IMAGING, TESTING) - I reviewed patient records, labs, notes, testing and imaging myself where available.  Lab Results  Component Value Date   WBC 6.2 01/27/2013   HGB 14.3 01/27/2013   HCT 41.8 01/27/2013   MCV 91.8 01/27/2013   PLT 236.0 01/27/2013      Component Value Date/Time   NA 134* 11/04/2013 1216   K 4.5 11/04/2013 1216   CL 94* 11/04/2013 1216   CO2 31 11/04/2013 1216   GLUCOSE 168* 11/04/2013 1216   BUN 15 11/04/2013 1216   CREATININE 0.9 11/04/2013 1216   CALCIUM 9.5 11/04/2013 1216   PROT 7.2 11/04/2013 1216   ALBUMIN 4.2 11/04/2013 1216   AST 20 11/04/2013 1216   ALT 17 11/04/2013 1216   ALKPHOS 64 11/04/2013 1216   BILITOT 0.6 11/04/2013 1216   GFRNONAA 91.78 10/27/2009 0737   GFRAA 76 10/23/2007 0950   Lab Results  Component  Value Date   CHOL 234* 01/27/2013   HDL 47.70 01/27/2013   LDLCALC 91 11/02/2010   LDLDIRECT 160.0 01/27/2013   TRIG 171.0* 01/27/2013   CHOLHDL 5 01/27/2013   Lab Results  Component Value Date   HGBA1C 9.4* 11/04/2013   No results found for this basename: VITAMINB12   Lab Results  Component Value Date   TSH 1.62 01/27/2013      ASSESSMENT AND PLAN 80 y.o. year old male  has a past medical history of Macular degeneration of left eye; Allergic rhinitis; Hypertension; Diabetes mellitus; Hiatal hernia; Diverticulosis of colon; Benign prostatic hypertrophy; Carcinoma of prostate; DJD (degenerative joint disease); History of gout; Anxiety; Glaucoma of left eye; Hyphema of left eye (06/2012); History of peptic ulcer; Hyperlipidemia; and Dementia arising in the senium and presenium (2015). here with:  1. Memory loss  Patient's memory has continued to decline according to his MMSE score. He scored 18/30 last visit and 16/30 this visit. I will start the patient on Aricept 5 mg daily. I reviewed the side effects with the patient and the daughter, they verbalized understanding. They will call our office if he is unable to tolerate this medication. Patient will follow-up in 1 month or sooner if needed.   Ward Givens, MSN, NP-C 01/01/2014, 2:02 PM [facility] [address] [place], [place] [address] [phone]  Note: This document was prepared with digital dictation and possible smart phrase technology. Any transcriptional errors that result from this process are unintentional.

## 2014-01-01 NOTE — Progress Notes (Signed)
I agree with the assessment and plan as directed by NP .The patient is known to me .   Tramain Gershman, MD  

## 2014-01-01 NOTE — Patient Instructions (Signed)
Donepezil tablets What is this medicine? DONEPEZIL (doe NEP e zil) is used to treat mild to moderate dementia caused by Alzheimer's disease. This medicine may be used for other purposes; ask your health care provider or pharmacist if you have questions. COMMON BRAND NAME(S): Aricept What should I tell my health care provider before I take this medicine? They need to know if you have any of these conditions: -asthma or other lung disease -difficulty passing urine -head injury -heart disease -history of irregular heartbeat -liver disease -seizures (convulsions) -stomach or intestinal disease, ulcers or stomach bleeding -an unusual or allergic reaction to donepezil, other medicines, foods, dyes, or preservatives -pregnant or trying to get pregnant -breast-feeding How should I use this medicine? Take this medicine by mouth with a glass of water. Follow the directions on the prescription label. You may take this medicine with or without food. Take this medicine at regular intervals. This medicine is usually taken before bedtime. Do not take it more often than directed. Continue to take your medicine even if you feel better. Do not stop taking except on your doctor's advice. If you are taking the 23 mg donepezil tablet, swallow it whole; do not cut, crush, or chew it. Talk to your pediatrician regarding the use of this medicine in children. Special care may be needed. Overdosage: If you think you have taken too much of this medicine contact a poison control center or emergency room at once. NOTE: This medicine is only for you. Do not share this medicine with others. What if I miss a dose? If you miss a dose, take it as soon as you can. If it is almost time for your next dose, take only that dose, do not take double or extra doses. What may interact with this medicine? Do not take this medicine with any of the following medications: -certain medicines for fungal infections like itraconazole,  fluconazole, posaconazole, and voriconazole -cisapride -dextromethorphan; quinidine -dofetilide -dronedarone -pimozide -quinidine -thioridazine -ziprasidone This medicine may also interact with the following medications: -antihistamines for allergy, cough and cold -atropine -bethanechol -carbamazepine -certain medicines for bladder problems like oxybutynin, tolterodine -certain medicines for Parkinson's disease like benztropine, trihexyphenidyl -certain medicines for stomach problems like dicyclomine, hyoscyamine -certain medicines for travel sickness like scopolamine -dexamethasone -ipratropium -NSAIDs, medicines for pain and inflammation, like ibuprofen or naproxen -other medicines for Alzheimer's disease -other medicines that prolong the QT interval (cause an abnormal heart rhythm) -phenobarbital -phenytoin -rifampin, rifabutin or rifapentine This list may not describe all possible interactions. Give your health care provider a list of all the medicines, herbs, non-prescription drugs, or dietary supplements you use. Also tell them if you smoke, drink alcohol, or use illegal drugs. Some items may interact with your medicine. What should I watch for while using this medicine? Visit your doctor or health care professional for regular checks on your progress. Check with your doctor or health care professional if your symptoms do not get better or if they get worse. You may get drowsy or dizzy. Do not drive, use machinery, or do anything that needs mental alertness until you know how this drug affects you. What side effects may I notice from receiving this medicine? Side effects that you should report to your doctor or health care professional as soon as possible: -allergic reactions like skin rash, itching or hives, swelling of the face, lips, or tongue -changes in vision -feeling faint or lightheaded, falls -problems with balance -slow heartbeat, or palpitations -stomach  pain -unusual bleeding or bruising,  red or purple spots on the skin -vomiting -weight loss Side effects that usually do not require medical attention (report to your doctor or health care professional if they continue or are bothersome): -diarrhea, especially when starting treatment -headache -indigestion or heartburn -loss of appetite -muscle cramps -nausea This list may not describe all possible side effects. Call your doctor for medical advice about side effects. You may report side effects to FDA at 1-800-FDA-1088. Where should I keep my medicine? Keep out of reach of children. Store at room temperature between 15 and 30 degrees C (59 and 86 degrees F). Throw away any unused medicine after the expiration date. NOTE: This sheet is a summary. It may not cover all possible information. If you have questions about this medicine, talk to your doctor, pharmacist, or health care provider.  2015, Elsevier/Gold Standard. (2013-08-10 21:44:11)

## 2014-01-15 ENCOUNTER — Telehealth: Payer: Self-pay | Admitting: Adult Health

## 2014-01-15 MED ORDER — NAMENDA XR TITRATION PACK 7 & 14 & 21 &28 MG PO CP24: ORAL_CAPSULE | ORAL | Status: DC

## 2014-01-15 NOTE — Telephone Encounter (Signed)
Patient's daughter stated he's experiencing agitation and hard to control since taking idonepezil (ARICEPT) 5 MG tablet.  Questioning how to wean patient off medication.  Please anytime and may leave detailed message on voicemail.

## 2014-01-15 NOTE — Telephone Encounter (Signed)
Reduce aricept to 5 mg daily for 4 days than stop. Has he tried namenda before? That's causing less agitation - NAMENDA -XR starter pack coupons are here in office - can be picked up and prescription can be used at pharmacy.

## 2014-01-15 NOTE — Telephone Encounter (Signed)
Daughter feels since patient began taking Aricept he is experiencing agitation, making it hard to control him.  She would like to know if this med could be discontinued and if anything else should be prescribed or recommended.  Has a follow up appt later this month.  Patient of MM/CD.  Jinny Blossom is out of the office today.  Please advise.  Thank you.

## 2014-01-15 NOTE — Telephone Encounter (Signed)
I called back, got no answer.  Left message.  

## 2014-02-02 ENCOUNTER — Ambulatory Visit: Payer: Medicare Other | Admitting: Adult Health

## 2014-02-17 ENCOUNTER — Telehealth: Payer: Self-pay | Admitting: Family Medicine

## 2014-02-17 NOTE — Telephone Encounter (Signed)
[facility] does not have the FL2. Can we contact [facility] to make arrangements? [place], Mudlogger
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
## 2014-02-17 NOTE — Telephone Encounter (Signed)
SW Sarah at [facility], she will have her Wallace fax over the form

## 2014-02-17 NOTE — Telephone Encounter (Signed)
Patient's daughter called back & gave the fax # [phone] for [name]. She is going to call [facility] to have them fax over the [facility] form. They can admit the patient tomorrow morning if the FL2 is returned today.

## 2014-02-18 ENCOUNTER — Ambulatory Visit: Payer: Medicare Other

## 2014-02-18 ENCOUNTER — Ambulatory Visit: Payer: Medicare Other | Admitting: Family Medicine

## 2014-02-18 ENCOUNTER — Other Ambulatory Visit: Payer: Medicare Other

## 2014-02-19 ENCOUNTER — Ambulatory Visit (INDEPENDENT_AMBULATORY_CARE_PROVIDER_SITE_OTHER): Payer: Medicare Other | Admitting: Family Medicine

## 2014-02-19 ENCOUNTER — Encounter: Payer: Self-pay | Admitting: Family Medicine

## 2014-02-19 VITALS — BP 112/68 | HR 59 | Temp 97.6°F | Resp 18 | Ht 68.0 in | Wt 140.0 lb

## 2014-02-19 DIAGNOSIS — E1165 Type 2 diabetes mellitus with hyperglycemia: Secondary | ICD-10-CM

## 2014-02-19 DIAGNOSIS — Z022 Encounter for examination for admission to residential institution: Secondary | ICD-10-CM

## 2014-02-19 DIAGNOSIS — F0390 Unspecified dementia without behavioral disturbance: Secondary | ICD-10-CM

## 2014-02-19 DIAGNOSIS — E7800 Pure hypercholesterolemia, unspecified: Secondary | ICD-10-CM

## 2014-02-19 DIAGNOSIS — E785 Hyperlipidemia, unspecified: Secondary | ICD-10-CM

## 2014-02-19 DIAGNOSIS — F068 Other specified mental disorders due to known physiological condition: Secondary | ICD-10-CM

## 2014-02-19 DIAGNOSIS — E119 Type 2 diabetes mellitus without complications: Secondary | ICD-10-CM

## 2014-02-19 DIAGNOSIS — I10 Essential (primary) hypertension: Secondary | ICD-10-CM

## 2014-02-19 DIAGNOSIS — Z23 Encounter for immunization: Secondary | ICD-10-CM

## 2014-02-19 DIAGNOSIS — Z111 Encounter for screening for respiratory tuberculosis: Secondary | ICD-10-CM

## 2014-02-19 DIAGNOSIS — IMO0002 Reserved for concepts with insufficient information to code with codable children: Secondary | ICD-10-CM

## 2014-02-19 LAB — LIPID PANEL
Cholesterol: 199 mg/dL (ref 0–200)
HDL: 36.8 mg/dL — ABNORMAL LOW (ref >39.00)
LDL Cholesterol: 124 mg/dL — ABNORMAL HIGH (ref 0–99)
NONHDL: 162.2
Total CHOL/HDL Ratio: 5
Triglycerides: 192 mg/dL — ABNORMAL HIGH (ref 0.0–149.0)
VLDL: 38.4 mg/dL (ref 0.0–40.0)

## 2014-02-19 LAB — HEMOGLOBIN A1C: HEMOGLOBIN A1C: 6.7 % — AB (ref 4.6–6.5)

## 2014-02-19 MED ORDER — LORAZEPAM 0.5 MG PO TABS: 0.5000 mg | ORAL_TABLET | Freq: Two times a day (BID) | ORAL | Status: DC | PRN

## 2014-02-19 NOTE — Progress Notes (Signed)
OFFICE VISIT  02/21/2014   CC: Discuss placement  HPI:    Patient is a 80 y.o. Caucasian male who presents with daughters Tessie Fass and Lovey Newcomer today for DM 2 f/u. No home glucose checks.  No signif diff in legs sx's or in memory/cognition since trial off of statin. Fasting today for repeat FLP and HbA1c.  Compliant with metformin, diovan, and cardizem, aspirin, his eye vitamins and his eye drops.  Neuro: aricept trial via neurologist made him worse so he was weened off of this and there is plan for namenda trial soon. He was having some increased irritation, argumentative, slight aggression and there was some previously prescribed xanax on hand and trial of this med at 1/2 tab 1-2 times a day for a couple of days made no difference.  Adult protective services has been involved with the family of late in order to see to a smooth transition from home to ALF.      Past Medical History  Diagnosis Date  . Macular degeneration of left eye   . Allergic rhinitis   . Hypertension   . Diabetes mellitus   . Hiatal hernia   . Diverticulosis of colon   . Benign prostatic hypertrophy   . Carcinoma of prostate   . DJD (degenerative joint disease)   . History of gout   . Anxiety   . Glaucoma of left eye   . Hyphema of left eye 06/2012  . History of peptic ulcer   . Hyperlipidemia   . Dementia arising in the senium and presenium 2015    Past Surgical History  Procedure Laterality Date  . 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 Appendectomy    . Tonsillectomy and adenoidectomy    . Right knee surgery      Laceration from a saw  . Left shoulder surgery      shoulder replacement  . Left cataract surgery  10/2008    Dr. Charise Killian  . Nasal sinus surgery  remote past    for sinus polyps  . Cardiovascular stress test  2005    Normal  . Colonoscopy  2007    diverticulosis  . Skin cancer excision  2011    Basal cell    Outpatient Prescriptions Prior to Visit  Medication  Sig Dispense Refill  . Difluprednate (DUREZOL) 0.05 % EMUL 1 drop into left eye as needed      . diltiazem (CARDIZEM) 120 MG tablet Take 1 tablet (120 mg total) by mouth daily.  30 tablet  6  . metFORMIN (GLUCOPHAGE) 1000 MG tablet 1/2 tab po bid  60 tablet  3  . Multiple Vitamins-Minerals (PRESERVISION/LUTEIN) CAPS Take 1 capsule by mouth daily.        . valsartan (DIOVAN) 80 MG tablet Take 1 tablet (80 mg total) by mouth daily.  30 tablet  6  . NAMENDA XR TITRATION PACK 7 & 14 & 21 &28 MG CP24 Starter pack a namenda XR is available in the office or by coupon for free -  1 capsule  0  . donepezil (ARICEPT) 5 MG tablet Take 1 tablet (5 mg total) by mouth at bedtime.  30 tablet  3   No facility-administered medications prior to visit.    Allergies  Allergen Reactions  . Atenolol     REACTION: INTOL Atenolol w/ bradycardia  . Codeine Phosphate     REACTION: nausea    ROS As per HPI  PE: Blood pressure 112/68, pulse  59, temperature 97.6 F (36.4 C), temperature source Temporal, resp. rate 18, height 5\' 8"  (1.727 m), weight 140 lb (63.504 kg), SpO2 99.00%. Gen: Alert, well appearing.  Patient is oriented to person, place, time, and situation. BTD:HRCB: no injection, icteris, swelling, or exudate.  EOMI, PERRLA. Mouth: lips without lesion/swelling.  Oral mucosa pink and moist. Oropharynx without erythema, exudate, or swelling.  Neck - No masses or thyromegaly or limitation in range of motion CV: RRR, no m/r/g.   LUNGS: CTA bilat, nonlabored resps, good aeration in all lung fields. ABD: soft, NT/ND EXT: no clubbing, cyanosis, or edema.    LABS:  None today  IMPRESSION AND PLAN:  Type 2 diabetes mellitus, uncontrolled Recheck HbA1c today.  No change in meds at this time.  HYPERCHOLESTEROLEMIA On statin until approx the last 4-6 mo. Trial off this med to see if it was affecting his LE's strength and/or cognition did not show any changes off meds. We'll reconsider restart of  statin after recheck of FLP today.  HTN (hypertension), benign The current medical regimen is effective;  continue present plan and medications.    Dementia arising in the senium and presenium Apparently got worse on aricept. He is off of this now and the next step per neuro is a trial of namenda. Family will comply with this but they want to get him transitioned into ALF first. Discussed use of (and rx'd) ativan 0.5-1 mg bid prn agitation.  Encounter for examination for admission to assisted living facility TB skin test placed today. Flu vaccine IM given today. He is currently w/out known communicable disease and is in his baseline state of health and ready for transition into ALF as per family's desires.  An After Visit Summary was printed and given to the patient.  FOLLOW UP: Return in about 3 months (around 05/22/2014) for routine chronic illness f/u.

## 2014-02-19 NOTE — Progress Notes (Signed)
Pre visit review using our clinic review tool, if applicable. No additional management support is needed unless otherwise documented below in the visit note. 

## 2014-02-21 DIAGNOSIS — Z022 Encounter for examination for admission to residential institution: Secondary | ICD-10-CM | POA: Insufficient documentation

## 2014-02-21 NOTE — Assessment & Plan Note (Signed)
On statin until approx the last 4-6 mo. Trial off this med to see if it was affecting his LE's strength and/or cognition did not show any changes off meds. We'll reconsider restart of statin after recheck of FLP today.

## 2014-02-21 NOTE — Assessment & Plan Note (Signed)
The current medical regimen is effective;  continue present plan and medications.  

## 2014-02-21 NOTE — Assessment & Plan Note (Signed)
TB skin test placed today. Flu vaccine IM given today. He is currently w/out known communicable disease and is in his baseline state of health and ready for transition into ALF as per family's desires.

## 2014-02-21 NOTE — Assessment & Plan Note (Addendum)
Apparently got worse on aricept. He is off of this now and the next step per neuro is a trial of namenda. Family will comply with this but they want to get him transitioned into ALF first. Discussed use of (and rx'd) ativan 0.5-1 mg bid prn agitation.

## 2014-02-21 NOTE — Assessment & Plan Note (Signed)
Recheck HbA1c today.  No change in meds at this time.

## 2014-02-22 ENCOUNTER — Telehealth: Payer: Self-pay | Admitting: Family Medicine

## 2014-02-22 ENCOUNTER — Ambulatory Visit: Payer: Medicare Other

## 2014-02-22 LAB — TB SKIN TEST
Induration: 0 mm
TB Skin Test: NEGATIVE

## 2014-02-22 MED ORDER — DILTIAZEM HCL ER COATED BEADS 120 MG PO CP24: 120.0000 mg | ORAL_CAPSULE | Freq: Every day | ORAL | Status: AC

## 2014-02-22 NOTE — Telephone Encounter (Signed)
Also, cardizem CD 120mg  rx printed to fax to his ALF and/or his pharmacy.-thx

## 2014-02-22 NOTE — Telephone Encounter (Signed)
emmi emailed °

## 2014-02-22 NOTE — Telephone Encounter (Signed)
Pt's ALF called stating that they can not split ativan tablets.  They need new RX.     Also, they said they need clarification on cardizem.  Please advise.

## 2014-02-22 NOTE — Telephone Encounter (Signed)
Randy Cohen spoke to pharmacist at [facility] and got everything fixed for pt.

## 2014-02-22 NOTE — Telephone Encounter (Signed)
I did not write an ativan rx that said to split tabs.  It says take 1-2 tabs at a time---no splitting tabs is needed.-thx

## 2014-02-23 ENCOUNTER — Ambulatory Visit: Payer: Medicare Other | Admitting: Family Medicine

## 2014-03-08 ENCOUNTER — Ambulatory Visit: Payer: Medicare Other | Admitting: Podiatry

## 2014-03-24 ENCOUNTER — Encounter: Payer: Self-pay | Admitting: Neurology

## 2014-03-30 ENCOUNTER — Encounter: Payer: Self-pay | Admitting: Neurology

## 2014-05-22 ENCOUNTER — Emergency Department (HOSPITAL_COMMUNITY)
Admission: EM | Admit: 2014-05-22 | Discharge: 2014-05-22 | Disposition: A | Discharge status: Home / Self Care | Payer: Medicare Other | Attending: Emergency Medicine | Admitting: Emergency Medicine

## 2014-05-22 ENCOUNTER — Encounter (HOSPITAL_COMMUNITY): Payer: Self-pay | Admitting: Emergency Medicine

## 2014-05-22 DIAGNOSIS — H02102 Unspecified ectropion of right lower eyelid: Secondary | ICD-10-CM | POA: Diagnosis not present

## 2014-05-22 DIAGNOSIS — H02106 Unspecified ectropion of left eye, unspecified eyelid: Secondary | ICD-10-CM | POA: Diagnosis not present

## 2014-05-22 DIAGNOSIS — I10 Essential (primary) hypertension: Secondary | ICD-10-CM | POA: Diagnosis not present

## 2014-05-22 DIAGNOSIS — Z87891 Personal history of nicotine dependence: Secondary | ICD-10-CM | POA: Diagnosis not present

## 2014-05-22 DIAGNOSIS — Z8709 Personal history of other diseases of the respiratory system: Secondary | ICD-10-CM | POA: Insufficient documentation

## 2014-05-22 DIAGNOSIS — Z87438 Personal history of other diseases of male genital organs: Secondary | ICD-10-CM | POA: Diagnosis not present

## 2014-05-22 DIAGNOSIS — Z8546 Personal history of malignant neoplasm of prostate: Secondary | ICD-10-CM | POA: Diagnosis not present

## 2014-05-22 DIAGNOSIS — R69 Illness, unspecified: Secondary | ICD-10-CM | POA: Diagnosis not present

## 2014-05-22 DIAGNOSIS — H02105 Unspecified ectropion of left lower eyelid: Secondary | ICD-10-CM | POA: Diagnosis not present

## 2014-05-22 DIAGNOSIS — Z79899 Other long term (current) drug therapy: Secondary | ICD-10-CM | POA: Diagnosis not present

## 2014-05-22 DIAGNOSIS — Z8739 Personal history of other diseases of the musculoskeletal system and connective tissue: Secondary | ICD-10-CM | POA: Diagnosis not present

## 2014-05-22 DIAGNOSIS — Z7982 Long term (current) use of aspirin: Secondary | ICD-10-CM | POA: Diagnosis not present

## 2014-05-22 DIAGNOSIS — E119 Type 2 diabetes mellitus without complications: Secondary | ICD-10-CM | POA: Diagnosis not present

## 2014-05-22 DIAGNOSIS — Z8639 Personal history of other endocrine, nutritional and metabolic disease: Secondary | ICD-10-CM | POA: Insufficient documentation

## 2014-05-22 DIAGNOSIS — F0390 Unspecified dementia without behavioral disturbance: Secondary | ICD-10-CM | POA: Insufficient documentation

## 2014-05-22 DIAGNOSIS — F419 Anxiety disorder, unspecified: Secondary | ICD-10-CM | POA: Insufficient documentation

## 2014-05-22 DIAGNOSIS — H02109 Unspecified ectropion of unspecified eye, unspecified eyelid: Secondary | ICD-10-CM

## 2014-05-22 DIAGNOSIS — H578 Other specified disorders of eye and adnexa: Secondary | ICD-10-CM | POA: Diagnosis present

## 2014-05-22 DIAGNOSIS — Z8711 Personal history of peptic ulcer disease: Secondary | ICD-10-CM | POA: Diagnosis not present

## 2014-05-22 DIAGNOSIS — Z8719 Personal history of other diseases of the digestive system: Secondary | ICD-10-CM | POA: Insufficient documentation

## 2014-05-22 DIAGNOSIS — H02103 Unspecified ectropion of right eye, unspecified eyelid: Secondary | ICD-10-CM | POA: Diagnosis not present

## 2014-05-22 MED ORDER — ARTIFICIAL TEARS OP OINT: TOPICAL_OINTMENT | Freq: Three times a day (TID) | OPHTHALMIC | Status: AC

## 2014-05-22 MED ORDER — ERYTHROMYCIN 5 MG/GM OP OINT: TOPICAL_OINTMENT | OPHTHALMIC | Status: DC

## 2014-05-22 MED ORDER — ERYTHROMYCIN 5 MG/GM OP OINT
TOPICAL_OINTMENT | Freq: Four times a day (QID) | OPHTHALMIC | Status: DC
  Administered 2014-05-22: 09:00:00 via OPHTHALMIC
  Filled 2014-05-22: qty 3.5

## 2014-05-22 NOTE — ED Notes (Signed)
PER EMS- picked up from [facility] c/o bilateral blood tinged tears x1 day, mucous membranes inflamed and pink. Denies pain or visual disturbance.  No injury.

## 2014-05-22 NOTE — ED Provider Notes (Addendum)
CSN: [id number]     Arrival date & time 05/22/14  [id number] History   First MD Initiated Contact with Patient 05/22/14 [phone]     Chief Complaint  Patient presents with  . Eye Problem    HPI The patient was sent to the emergency room according to EMS reports because they noticed blood-tinged tears for the last 1 day. Patient has history of dementia and some memory issues. Patient denies any specific complaints with his eyes. He says he has a history of poor vision out of the left eye. This has been ongoing for an extended period of time. Patient denies any complaints with his right eye. Denies any fevers or chills. He denies any pain. Does not recall any injuries. Past Medical History  Diagnosis Date  . Macular degeneration of left eye   . Allergic rhinitis   . Hypertension   . Diabetes mellitus   . Hiatal hernia   . Diverticulosis of colon   . Benign prostatic hypertrophy   . Carcinoma of prostate   . DJD (degenerative joint disease)   . History of gout   . Anxiety   . Glaucoma of left eye   . Hyphema of left eye 06/2012  . History of peptic ulcer   . Hyperlipidemia   . Dementia arising in the senium and presenium 2015   Past Surgical History  Procedure Laterality Date  . 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 Appendectomy    . Tonsillectomy and adenoidectomy    . Right knee surgery      Laceration from a saw  . Left shoulder surgery      shoulder replacement  . Left cataract surgery  10/2008    Dr. Charise Killian  . Nasal sinus surgery  remote past    for sinus polyps  . Cardiovascular stress test  2005    Normal  . Colonoscopy  2007    diverticulosis  . Skin cancer excision  2011    Basal cell   Family History  Problem Relation Age of Onset  . Colon cancer Brother   . Macular degeneration Brother   . Heart Problems Brother     pacemaker  . Breast cancer Sister    History  Substance Use Topics  . Smoking status: Former Smoker -- 3.00 packs/day for 25  years    Types: Cigarettes    Quit date: 05/07/1958  . Smokeless tobacco: Never Used  . Alcohol Use: No    Review of Systems  All other systems reviewed and are negative.     Allergies  Aricept; Atenolol; and Codeine phosphate  Home Medications   Prior to Admission medications   Medication Sig Start Date End Date Taking? Authorizing Provider  alum & mag hydroxide-simeth (MAALOX/MYLANTA) 200-200-20 MG/5ML suspension Take 30 mLs by mouth every 6 (six) hours as needed for indigestion or heartburn.   Yes Historical Provider, MD  aspirin EC 81 MG tablet Take 81 mg by mouth daily.   Yes Historical Provider, MD  Difluprednate (DUREZOL) 0.05 % EMUL Place 1 drop into the left eye daily as needed (pain).    Yes Historical Provider, MD  diltiazem (CARDIZEM CD) 120 MG 24 hr capsule Take 1 capsule (120 mg total) by mouth daily. 02/22/14  Yes Tammi Sou, MD  LORazepam (ATIVAN) 0.5 MG tablet Take 1 tablet (0.5 mg total) by mouth 2 (two) times daily as needed for anxiety. Patient taking differently: Take 0.5 mg by mouth daily as  needed for anxiety.  02/19/14  Yes Tammi Sou, MD  metFORMIN (GLUCOPHAGE) 500 MG tablet Take 500 mg by mouth 2 (two) times daily with a meal.   Yes Historical Provider, MD  Multiple Vitamins-Minerals (PRESERVISION AREDS PO) Take 1 tablet by mouth 2 (two) times daily.   Yes Historical Provider, MD  Skin Protectants, Misc. (MINERIN) CREA Apply 1 application topically daily.   Yes Historical Provider, MD  valsartan (DIOVAN) 80 MG tablet Take 1 tablet (80 mg total) by mouth daily. 05/04/13  Yes Noralee Space, MD  artificial tears (LACRILUBE) OINT ophthalmic ointment Place into both eyes 3 (three) times daily. 05/22/14   Dorie Rank, MD  erythromycin ophthalmic ointment Place a 1/2 inch ribbon of ointment into the lower eyelid Every 6 hours 05/22/14   Dorie Rank, MD  metFORMIN (GLUCOPHAGE) 1000 MG tablet 1/2 tab po bid Patient not taking: Reported on 05/22/2014 11/19/13    Tammi Sou, MD  NAMENDA XR TITRATION PACK 7 & 14 & 21 &28 MG CP24 Starter pack a namenda XR is available in the office or by coupon for free - Patient not taking: Reported on 05/22/2014 01/15/14   Asencion Partridge Dohmeier, MD   BP 163/81 mmHg  Pulse 64  Temp(Src) 97.4 F (36.3 C) (Oral)  Resp 20  SpO2 100% Physical Exam  Constitutional: He appears well-developed and well-nourished. No distress.  HENT:  Head: Normocephalic and atraumatic.  Right Ear: External ear normal.  Left Ear: External ear normal.  Eyes: Conjunctivae and EOM are normal. Right eye exhibits no discharge. Left eye exhibits no discharge. No scleral icterus.  Bilateral ectropion, mucosa is dry and irritated on the inferior lid bilaterally, left pupil is irregular and immobile consistent with prior surgery the sclera are clear and not erythematous, no hyphema, no purulent discharge  Neck: Neck supple. No tracheal deviation present.  Cardiovascular: Normal rate.   Pulmonary/Chest: Effort normal. No stridor. No respiratory distress.  Musculoskeletal: He exhibits no edema.  Neurological: He is alert. Cranial nerve deficit: no gross deficits.  Skin: Skin is warm and dry. No rash noted.  Psychiatric: He has a normal mood and affect.  Nursing note and vitals reviewed.   ED Course  Procedures (including critical care time) Labs Review Labs Reviewed - No data to display  Imaging Review No results found.   EKG Interpretation None      MDM   Final diagnoses:  Ectropion of lower eyelid    Patient has bilateral ectropion. Mucosa is dry and irritated. I suspect this is the cause for his blood tinged tears. There is no active bleeding noted on exam.  Erythromycin eye ointment placed in the emergency department. Also given a prescription for Lacri-Lube ointment. Follow up with an ophthalmologist     Dorie Rank, MD 05/22/14 [place], MD 05/22/14 [phone]

## 2014-05-22 NOTE — ED Notes (Signed)
MD bedside discussing care with son in law

## 2014-05-22 NOTE — ED Notes (Signed)
Bed: TI14 Expected date: 05/22/14 Expected time: 8:43 AM Means of arrival: Ambulance Comments: Eye problems

## 2014-05-22 NOTE — ED Notes (Signed)
Phone call complete to Randy Cohen who reports she is pt's POA.  En route to pick up pt for discharge.

## 2014-05-22 NOTE — Discharge Instructions (Signed)
Ectropion Repair An ectropion is when the lower eyelid becomes weak. The eyelid sags away from its normal position against the eyeball. This can happen in older people when the skin and muscle tone of the face loosens with age. CAUSES  Age.  Sagging or too much skin of the face and lids.  Certain skin diseases.  Scarring from burns, chemical burns, plastic or facial surgery, or trauma. SYMPTOMS   A lot of tearing.  Outwardly drooping lid.  Sagging skin around the eyes.  Sensitivity to light and wind. DIAGNOSIS  An eye professional can tell you if you have an ectropion during an eye exam. LET YOUR CAREGIVER KNOW ABOUT:   Medications taken including herbs, eye drops, prescription medications, over-the-counter medications, and creams.  Use of steroids, such as prednisone (by mouth or creams).  Use of aspirin or blood-thinning medicines.  History of bleeding or blood problems.  Past problems with anesthetics or novocaine.  Possibility of pregnancy, if this applies.  History of blood clots (thrombophlebitis).  Past surgery.  Other health problems. BEFORE THE PROCEDURE  Drops or ointments may be prescribed. This will relieve irritation and protect the eyeball until the lid is repaired. PROCEDURE  Outwardly turned eyes can only be repaired with surgery.  This surgery can be done on an outpatient basis. A needle will inject a drug (local anesthesia) in the eyelid to make the area loose feeling.  There are many different ways used to repair an ectropion. Your doctor will decide which is best for you. AFTER THE PROCEDURE  Some stitches may need to be removed. They may be removed all at once or in several steps between 5 and 10 days after surgery. Sometimes stitches that keep the eyelid edges lined up are left for a few days longer than the stitches in the skin of the eyelid.  HOME CARE INSTRUCTIONS   Eye drops, ointments or pills by mouth may be prescribed. Use as directed  for the full time directed.  Let your caregiver know if your eye becomes more red and swollen with the use of medications. This could be an allergic reaction.  Only take over-the-counter or prescription medicines for pain, discomfort or fever as directed by your caregiver.  The material used to cover the wound (dressing) should be kept clean and dry until removed or changed. Do as directed by your caregiver.  Avoid lifting and bending over until you are told otherwise.  Recovery time is short. You may resume normal activities as directed or allowed. SEEK MEDICAL CARE IF:   There is redness, swelling or increasing pain in the wound or around the eye or face.  Pus is coming from the wound.  The eye itself becomes red and inflamed.  The eye is sensitive to light.  An unexplained oral temperature above 102 F (38.9 C) develops.  There is a breaking open of the wound edges after stitches (sutures) have been removed.  You still feel like you have something in your eye after the surgery. SEEK IMMEDIATE MEDICAL CARE IF:   There is any change in vision.  You develop a rash.  You have a hard time breathing.  You develop any reaction or side effects to medications given. Document Released: 07/30/2000 Document Revised: 09/07/2013 Document Reviewed: 08/19/2007 [facility] Patient Information 2015 [place], [place]. This information is not intended to replace advice given to you by your health care provider. Make sure you discuss any questions you have with your health care provider.

## 2014-05-25 DIAGNOSIS — E1165 Type 2 diabetes mellitus with hyperglycemia: Secondary | ICD-10-CM | POA: Diagnosis not present

## 2014-05-25 DIAGNOSIS — I10 Essential (primary) hypertension: Secondary | ICD-10-CM | POA: Diagnosis not present

## 2014-05-25 DIAGNOSIS — G309 Alzheimer's disease, unspecified: Secondary | ICD-10-CM | POA: Diagnosis not present

## 2014-05-26 DIAGNOSIS — E119 Type 2 diabetes mellitus without complications: Secondary | ICD-10-CM | POA: Diagnosis not present

## 2014-06-08 DIAGNOSIS — H02135 Senile ectropion of left lower eyelid: Secondary | ICD-10-CM | POA: Diagnosis not present

## 2014-06-08 DIAGNOSIS — H10503 Unspecified blepharoconjunctivitis, bilateral: Secondary | ICD-10-CM | POA: Diagnosis not present

## 2014-06-08 DIAGNOSIS — H3531 Nonexudative age-related macular degeneration: Secondary | ICD-10-CM | POA: Diagnosis not present

## 2014-06-11 ENCOUNTER — Telehealth: Payer: Self-pay | Admitting: Family Medicine

## 2014-06-11 NOTE — Telephone Encounter (Signed)
Patient's daughter would like to know if Dr. Anitra Lauth makes house calls to nursing homes. Patient is at [facility] in [place].

## 2014-06-11 NOTE — Telephone Encounter (Signed)
Please advise 

## 2014-06-11 NOTE — Telephone Encounter (Signed)
Sorry, I don't see any patients in nursing homes.  He can still come see me in my office but I can't go to see him at the NH.--thx

## 2014-06-14 ENCOUNTER — Telehealth: Payer: Self-pay | Admitting: Family Medicine

## 2014-06-14 NOTE — Telephone Encounter (Signed)
Left detailed message on cell. Okay per DPR 

## 2014-06-14 NOTE — Telephone Encounter (Signed)
OK, but have her give as much info as possible, such as how often she anticipates having to leave work to take care of him, how long each absence may be (estimates are fine) and also ask her to state what type of things she'll be doing for him when she has to leave work for him.  She can drop the form off and let her know it will be 20$ -our standard fee for this form.-thx

## 2014-06-14 NOTE — Telephone Encounter (Signed)
Pt's daughter, Tessie Fass is requesting FMLA paper work for intermittent use only so she can take care of her dad.  Please advise if that's okay?

## 2014-06-14 NOTE — Telephone Encounter (Signed)
LMOM for pt's daughter.

## 2014-06-14 NOTE — Telephone Encounter (Signed)
Patients daughter aware

## 2014-07-23 ENCOUNTER — Encounter: Payer: Self-pay | Admitting: Family Medicine

## 2014-07-23 ENCOUNTER — Ambulatory Visit (INDEPENDENT_AMBULATORY_CARE_PROVIDER_SITE_OTHER): Payer: Medicare Other | Admitting: Family Medicine

## 2014-07-23 VITALS — BP 131/71 | HR 75 | Temp 98.5°F | Ht 68.0 in | Wt 144.0 lb

## 2014-07-23 DIAGNOSIS — F068 Other specified mental disorders due to known physiological condition: Secondary | ICD-10-CM

## 2014-07-23 DIAGNOSIS — Z08 Encounter for follow-up examination after completed treatment for malignant neoplasm: Secondary | ICD-10-CM | POA: Diagnosis not present

## 2014-07-23 DIAGNOSIS — E1165 Type 2 diabetes mellitus with hyperglycemia: Secondary | ICD-10-CM | POA: Diagnosis not present

## 2014-07-23 DIAGNOSIS — L219 Seborrheic dermatitis, unspecified: Secondary | ICD-10-CM

## 2014-07-23 DIAGNOSIS — H02105 Unspecified ectropion of left lower eyelid: Secondary | ICD-10-CM

## 2014-07-23 DIAGNOSIS — Z8546 Personal history of malignant neoplasm of prostate: Secondary | ICD-10-CM | POA: Diagnosis not present

## 2014-07-23 DIAGNOSIS — IMO0002 Reserved for concepts with insufficient information to code with codable children: Secondary | ICD-10-CM

## 2014-07-23 DIAGNOSIS — H6191 Disorder of right external ear, unspecified: Secondary | ICD-10-CM

## 2014-07-23 DIAGNOSIS — F0390 Unspecified dementia without behavioral disturbance: Secondary | ICD-10-CM

## 2014-07-23 DIAGNOSIS — I10 Essential (primary) hypertension: Secondary | ICD-10-CM

## 2014-07-23 DIAGNOSIS — H02103 Unspecified ectropion of right eye, unspecified eyelid: Secondary | ICD-10-CM

## 2014-07-23 LAB — HEMOGLOBIN A1C: Hgb A1c MFr Bld: 6.6 % — ABNORMAL HIGH (ref 4.6–6.5)

## 2014-07-23 LAB — PSA, MEDICARE: PSA: 0.05 ng/mL — AB (ref 0.10–4.00)

## 2014-07-23 MED ORDER — MUPIROCIN 2 % EX OINT: TOPICAL_OINTMENT | CUTANEOUS | Status: DC

## 2014-07-23 MED ORDER — LORAZEPAM 0.5 MG PO TABS: ORAL_TABLET | ORAL | Status: DC

## 2014-07-23 NOTE — Progress Notes (Signed)
Office Note 07/23/2014  CC:  Chief Complaint  Patient presents with  . Follow-up   HPI:  Randy Cohen is a 81 y.o. White male who is here accompanied by his daughter for follow up DM 2, HTN, hyperlipidemia, and dementia.  He has been doing fine. Apparently a trial of namenda since I last saw him did not help any.  He has failed aricept as well. Family/pt don't have plans to revisit neurologist or consider further dementia med trials at this time. He has no aggressive behavior but the problem that the staff at [facility] and his family have with him from a behavioral standpoint is getting him to bathe.  He bathes about once a week and as a result he carries a bad body odor.   He insists to me today that he does bathe; "I took a shower just last night".  Daughter shakes her head "no". When questioned, he says he has no fear of the water, no preference for a bath instead of shower, and he feels like he has adequate privacy.  I encouraged him to bathe at least every other day and we discussed the use of ativan to try to help make him a bit more receptive to taking a shower when asked.  The staff, as per orders, have tried giving him 1 of the 0.5mg  ativan tabs prior to trying to get him to take a shower and it had no effect on pt's decision.  His daughter is asking if I can authorize them to give a higher dose or perhaps a different med to try to help with this situation.  I reviewed his glucoses: fastings around 110 avg.  2 H PP lunch avg 180-190. BP avg syst 150s, avg diast 90s, HR avg 75.  He has had an irritated area on right ear for the last week or so at least, daughter says he picks at it and another smaller area on lower part of same ear.  Has had metronidazole gel rx'd in the past for some areas of rosacea on his face, but these areas are different.  In fact, daughter says the metronidazole is very expensive and asks me to d/c this med.    Recently dx'd with bilateral eyelid  ectropion.  Needs order for J&J baby shampoo soaks qd as per the eye MD's rec's.     Past Medical History  Diagnosis Date  . Macular degeneration of left eye   . Allergic rhinitis   . Hypertension   . Diabetes mellitus   . Hiatal hernia   . Diverticulosis of colon   . Benign prostatic hypertrophy   . Carcinoma of prostate   . DJD (degenerative joint disease)   . History of gout   . Anxiety   . Glaucoma of left eye   . Hyphema of left eye 06/2012  . History of peptic ulcer   . Hyperlipidemia   . Dementia arising in the senium and presenium 2015    Past Surgical History  Procedure Laterality Date  . 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 Appendectomy    . Tonsillectomy and adenoidectomy    . Right knee surgery      Laceration from a saw  . Left shoulder surgery      shoulder replacement  . Left cataract surgery  10/2008    Dr. Charise Killian  . Nasal sinus surgery  remote past    for sinus polyps  . Cardiovascular stress test  2005    Normal  . Colonoscopy  2007    diverticulosis  . Skin cancer excision  2011    Basal cell    Family History  Problem Relation Age of Onset  . Colon cancer Brother   . Macular degeneration Brother   . Heart Problems Brother     pacemaker  . Breast cancer Sister     History   Social History  . Marital Status: Married    Spouse Name: joann Kiely  . Number of Children: 6  . Years of Education: 8   Occupational History  . Not on file.   Social History Main Topics  . Smoking status: Former Smoker -- 3.00 packs/day for 25 years    Types: Cigarettes    Quit date: 05/07/1958  . Smokeless tobacco: Never Used  . Alcohol Use: No  . Drug Use: No  . Sexual Activity: Not on file   Other Topics Concern  . Not on file   Social History Narrative   Married Mechele Claude) , 6 children.     Pt lives in [place], memory unit.  Pt's wife has alzheimers dz and is also living at [facility].   Orig from [place].    Quit smoking around age 51.  No alcohol.   Patient is retired.   Patient has a 8th grade education.   Patient is right-handed.   Patient drinks two cups of coffee daily, one Pepsi/tea per day.    Outpatient Encounter Prescriptions as of 07/23/2014  Medication Sig  . alum & mag hydroxide-simeth (MAALOX/MYLANTA) 200-200-20 MG/5ML suspension Take 30 mLs by mouth every 6 (six) hours as needed for indigestion or heartburn.  Marland Kitchen artificial tears (LACRILUBE) OINT ophthalmic ointment Place into both eyes 3 (three) times daily.  Marland Kitchen aspirin EC 81 MG tablet Take 81 mg by mouth daily.  . Difluprednate (DUREZOL) 0.05 % EMUL Place 1 drop into the left eye daily as needed (pain).   Marland Kitchen diltiazem (CARDIZEM CD) 120 MG 24 hr capsule Take 1 capsule (120 mg total) by mouth daily.  Marland Kitchen LORazepam (ATIVAN) 0.5 MG tablet 1 tab po bid prn agitation  . metFORMIN (GLUCOPHAGE) 1000 MG tablet 1/2 tab po bid  . metFORMIN (GLUCOPHAGE) 500 MG tablet Take 500 mg by mouth 2 (two) times daily with a meal.  . Multiple Vitamins-Minerals (PRESERVISION AREDS PO) Take 1 tablet by mouth 2 (two) times daily.  . Skin Protectants, Misc. (MINERIN) CREA Apply 1 application topically daily.  . valsartan (DIOVAN) 80 MG tablet Take 1 tablet (80 mg total) by mouth daily.  . [DISCONTINUED] LORazepam (ATIVAN) 0.5 MG tablet Take 1 tablet (0.5 mg total) by mouth 2 (two) times daily as needed for anxiety. (Patient taking differently: Take 0.5 mg by mouth daily as needed for anxiety. )  . mupirocin ointment (BACTROBAN) 2 % Apply to opened skin areas on right ear tid x 10d  . [DISCONTINUED] erythromycin ophthalmic ointment Place a 1/2 inch ribbon of ointment into the lower eyelid Every 6 hours (Patient not taking: Reported on 07/23/2014)  . [DISCONTINUED] NAMENDA XR TITRATION PACK 7 & 14 & 21 &28 MG CP24 Starter pack a namenda XR is available in the office or by coupon for free - (Patient not taking: Reported on 07/23/2014)    Allergies  Allergen  Reactions  . Aricept [Donepezil Hcl]     Decreased appetite  . Atenolol     REACTION: INTOL Atenolol w/ bradycardia  . Codeine Phosphate  REACTION: nausea    ROS Review of Systems  Constitutional: Negative for fever and fatigue.  HENT: Negative for congestion and sore throat.   Respiratory: Negative for cough.   Cardiovascular: Negative for chest pain.  Gastrointestinal: Negative for nausea and abdominal pain.  Genitourinary: Negative for dysuria.  Musculoskeletal: Negative for back pain and joint swelling.  Neurological: Negative for weakness and headaches.  Hematological: Negative for adenopathy.  Psychiatric/Behavioral: Negative for self-injury and dysphoric mood.   PE; Blood pressure 131/71, pulse 75, temperature 98.5 F (36.9 C), temperature source Temporal, height 5\' 8"  (1.727 m), weight 144 lb (65.318 kg), SpO2 99 %. Gen: Alert, well appearing.  Patient is oriented to person, otherwise is pleasantly demented. WYO:VZCH: no injection, icteris, swelling, or exudate.  He has ectropion of both lower eyelids.  No exudate or focal irritation/inflammation.  EOMI, pupils symmetric. Mouth: lips without lesion/swelling.  Oral mucosa pink and moist. Oropharynx without erythema, exudate, or swelling.  Neck - No masses or thyromegaly or limitation in range of motion CV: RRR, no m/r/g Chest is clear, no wheezing or rales. Normal symmetric air entry throughout both lung fields. No chest wall deformities or tenderness. ABd: soft, NT/ND EXT: no clubbing, cyanosis, or edema.  Neuro: CN 2-12 intact bilaterally, strength 5/5 in proximal and distal upper extremities and lower extremities bilaterally.  No tremor.  No ataxia.    No pronator drift. SKIN: a few splotches of pinkish macular rash on forehead and temples, with slight hyperkeratosis texture. Right ear, top of pinna with 1 cm nodular growth with slightly ulcerated central portion.  Some dried blood as well as mild peripheral erythema  is noted.  Smaller area on inferior portion of right ear which is a small abrasion with dried blood on it.    Pertinent labs:  Lab Results  Component Value Date   TSH 1.62 01/27/2013   Lab Results  Component Value Date   WBC 6.2 01/27/2013   HGB 14.3 01/27/2013   HCT 41.8 01/27/2013   MCV 91.8 01/27/2013   PLT 236.0 01/27/2013   Lab Results  Component Value Date   CREATININE 0.9 11/04/2013   BUN 15 11/04/2013   NA 134* 11/04/2013   K 4.5 11/04/2013   CL 94* 11/04/2013   CO2 31 11/04/2013   Lab Results  Component Value Date   ALT 17 11/04/2013   AST 20 11/04/2013   ALKPHOS 64 11/04/2013   BILITOT 0.6 11/04/2013   Lab Results  Component Value Date   CHOL 199 02/19/2014   Lab Results  Component Value Date   HDL 36.80* 02/19/2014   Lab Results  Component Value Date   LDLCALC 124* 02/19/2014   Lab Results  Component Value Date   TRIG 192.0* 02/19/2014   Lab Results  Component Value Date   CHOLHDL 5 02/19/2014   Lab Results  Component Value Date   PSA 0.05* 07/23/2014   PSA 0.03* 01/27/2013   PSA 0.07* 10/27/2009   Lab Results  Component Value Date   HGBA1C 6.6* 07/23/2014    ASSESSMENT AND PLAN:   1) DM 2, without complications, control has been good.  Some elevated postprandials lately, though. Recheck HbA1c today. He is going to get his diab retpthy screening exam at f/u with his eye MD in the next couple of months.  2) HTN; fair control.  I am being more lenient with him regarding thresh-hold for increasing med/adding med.  3) Dementia w/out significant combative behavior but I am ok with family's  request to try a higher dose of ativan in order to try to make him more agreeable to the idea of taking a bath at least every other day.  Order written to give TWO of the 0.5mg  ativan tabs bid prn.  Will increase gradually if needed before trying a different med.  4) Prostate cancer surveillance: PSA drawn today.  5) Mild seborrheic dermatitis on face.   Ok to d/c metronidazole cream as per daughter's request. I recommended he make appt at his dermatologist (daughter says he goes to [facility] dermatology) to get the lesion on his right ear pinna evaluated b/c it looks like SCC or BCC.  Daughter agreed with this plan. I also wrote rx for bactroban ointment 2% to apply to the opened skin areas on his ear tid x 10d.  6) Bilateral lower eyelid ectropion.  He is s/p a round of erythromycin ophth ointment for this, and now the eye MD recommends daily J&J baby shampoo lid soaks--order written today.  An After Visit Summary was printed and given to the patient.  Return in about 3 months (around 10/23/2014) for routine chronic illness f/u.

## 2014-07-23 NOTE — Progress Notes (Signed)
Pre visit review using our clinic review tool, if applicable. No additional management support is needed unless otherwise documented below in the visit note. 

## 2014-07-26 ENCOUNTER — Other Ambulatory Visit: Payer: Self-pay | Admitting: *Deleted

## 2014-07-26 NOTE — Telephone Encounter (Signed)
error 

## 2014-09-06 ENCOUNTER — Telehealth: Payer: Self-pay | Admitting: Family Medicine
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
## 2014-09-06 NOTE — Telephone Encounter (Signed)
Please advise 

## 2014-09-08 MED ORDER — DIVALPROEX SODIUM 250 MG PO DR TAB: 250.0000 mg | DELAYED_RELEASE_TABLET | Freq: Two times a day (BID) | ORAL | Status: DC

## 2014-09-08 NOTE — Telephone Encounter (Signed)
I'll need to write an order to d/c ativan so we can fax this to [facility]. I printed rx for depakote for him to start ASAP. I will do some routine monitoring labs at his next f/u office visit.-thx

## 2014-09-08 NOTE — Telephone Encounter (Signed)
Rx faxed to [facility].

## 2014-09-20 DIAGNOSIS — L570 Actinic keratosis: Secondary | ICD-10-CM | POA: Diagnosis not present

## 2014-09-20 DIAGNOSIS — C44319 Basal cell carcinoma of skin of other parts of face: Secondary | ICD-10-CM | POA: Diagnosis not present

## 2014-09-20 DIAGNOSIS — C44212 Basal cell carcinoma of skin of right ear and external auricular canal: Secondary | ICD-10-CM | POA: Diagnosis not present

## 2014-09-24 ENCOUNTER — Ambulatory Visit (INDEPENDENT_AMBULATORY_CARE_PROVIDER_SITE_OTHER): Payer: Medicare Other | Admitting: Ophthalmology

## 2014-09-24 DIAGNOSIS — H43811 Vitreous degeneration, right eye: Secondary | ICD-10-CM

## 2014-09-24 DIAGNOSIS — I10 Essential (primary) hypertension: Secondary | ICD-10-CM | POA: Diagnosis not present

## 2014-09-24 DIAGNOSIS — H3531 Nonexudative age-related macular degeneration: Secondary | ICD-10-CM

## 2014-09-24 DIAGNOSIS — H35033 Hypertensive retinopathy, bilateral: Secondary | ICD-10-CM

## 2014-10-05 DIAGNOSIS — E119 Type 2 diabetes mellitus without complications: Secondary | ICD-10-CM | POA: Diagnosis not present

## 2014-10-26 DIAGNOSIS — C44319 Basal cell carcinoma of skin of other parts of face: Secondary | ICD-10-CM | POA: Diagnosis not present

## 2014-10-26 DIAGNOSIS — C44212 Basal cell carcinoma of skin of right ear and external auricular canal: Secondary | ICD-10-CM | POA: Diagnosis not present

## 2014-12-06 DIAGNOSIS — F489 Nonpsychotic mental disorder, unspecified: Secondary | ICD-10-CM | POA: Diagnosis not present

## 2014-12-06 DIAGNOSIS — F0391 Unspecified dementia with behavioral disturbance: Secondary | ICD-10-CM | POA: Diagnosis not present

## 2014-12-06 DIAGNOSIS — I10 Essential (primary) hypertension: Secondary | ICD-10-CM | POA: Diagnosis not present

## 2014-12-07 DIAGNOSIS — E780 Pure hypercholesterolemia: Secondary | ICD-10-CM | POA: Diagnosis not present

## 2014-12-07 DIAGNOSIS — R278 Other lack of coordination: Secondary | ICD-10-CM | POA: Diagnosis not present

## 2014-12-07 DIAGNOSIS — E1165 Type 2 diabetes mellitus with hyperglycemia: Secondary | ICD-10-CM | POA: Diagnosis not present

## 2014-12-07 DIAGNOSIS — F0391 Unspecified dementia with behavioral disturbance: Secondary | ICD-10-CM | POA: Diagnosis not present

## 2014-12-07 DIAGNOSIS — F489 Nonpsychotic mental disorder, unspecified: Secondary | ICD-10-CM | POA: Diagnosis not present

## 2014-12-07 DIAGNOSIS — I10 Essential (primary) hypertension: Secondary | ICD-10-CM | POA: Diagnosis not present

## 2014-12-09 DIAGNOSIS — R278 Other lack of coordination: Secondary | ICD-10-CM | POA: Diagnosis not present

## 2014-12-10 DIAGNOSIS — R278 Other lack of coordination: Secondary | ICD-10-CM | POA: Diagnosis not present

## 2014-12-16 DIAGNOSIS — R278 Other lack of coordination: Secondary | ICD-10-CM | POA: Diagnosis not present

## 2014-12-17 DIAGNOSIS — R278 Other lack of coordination: Secondary | ICD-10-CM | POA: Diagnosis not present

## 2014-12-24 DIAGNOSIS — R278 Other lack of coordination: Secondary | ICD-10-CM | POA: Diagnosis not present

## 2014-12-28 DIAGNOSIS — E119 Type 2 diabetes mellitus without complications: Secondary | ICD-10-CM | POA: Diagnosis not present

## 2014-12-28 DIAGNOSIS — I10 Essential (primary) hypertension: Secondary | ICD-10-CM | POA: Diagnosis not present

## 2014-12-28 DIAGNOSIS — M150 Primary generalized (osteo)arthritis: Secondary | ICD-10-CM | POA: Diagnosis not present

## 2014-12-28 DIAGNOSIS — E780 Pure hypercholesterolemia: Secondary | ICD-10-CM | POA: Diagnosis not present

## 2014-12-28 DIAGNOSIS — F0281 Dementia in other diseases classified elsewhere with behavioral disturbance: Secondary | ICD-10-CM | POA: Diagnosis not present

## 2015-01-05 DIAGNOSIS — E119 Type 2 diabetes mellitus without complications: Secondary | ICD-10-CM | POA: Diagnosis not present

## 2015-02-18 ENCOUNTER — Telehealth: Payer: Self-pay | Admitting: *Deleted
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
## 2015-02-19 ENCOUNTER — Other Ambulatory Visit: Payer: Self-pay | Admitting: Family Medicine

## 2015-02-19 MED ORDER — FLUOXETINE HCL 10 MG PO TABS: 10.0000 mg | ORAL_TABLET | Freq: Every day | ORAL | Status: DC

## 2015-02-19 NOTE — Telephone Encounter (Signed)
Written order for fluoxetine 10mg  placed on your keyboard.

## 2015-02-21 NOTE — Telephone Encounter (Signed)
Rx faxed

## 2015-03-04 ENCOUNTER — Telehealth: Payer: Self-pay | Admitting: *Deleted

## 2015-03-04 NOTE — Telephone Encounter (Signed)
Lattie Haw at [facility] advised and voiced understanding.

## 2015-03-04 NOTE — Telephone Encounter (Signed)
Yes, ok to change to capsules-thx

## 2015-03-04 NOTE — Telephone Encounter (Signed)
[facility] LMOM on 03/04/15 at 11:18am stating that a Rx for fluoxitine was sent in for the tablets but pts insurance will only cover the capsules. Please advise if it is okay to change to capsules. Thanks.

## 2015-03-18 ENCOUNTER — Telehealth: Payer: Self-pay | Admitting: *Deleted

## 2015-03-18 ENCOUNTER — Encounter: Payer: Self-pay | Admitting: Family Medicine

## 2015-03-18 DIAGNOSIS — Z79899 Other long term (current) drug therapy: Secondary | ICD-10-CM | POA: Insufficient documentation

## 2015-03-18 DIAGNOSIS — E119 Type 2 diabetes mellitus without complications: Secondary | ICD-10-CM | POA: Insufficient documentation

## 2015-03-18 NOTE — Telephone Encounter (Signed)
Rx with orders placed on your desk.-thx

## 2015-03-18 NOTE — Telephone Encounter (Signed)
Rx faxed

## 2015-03-18 NOTE — Telephone Encounter (Signed)
Santiago Glad called wanting to know if you would like them to do any labs for pt. She stated that all she will need is a Rx with labs orders faxed to [phone]. Please advise.Thanks.

## 2015-03-25 ENCOUNTER — Telehealth: Payer: Self-pay | Admitting: Family Medicine

## 2015-03-25 NOTE — Telephone Encounter (Signed)
Left message for pt's daughter Tessie Fass, to return call.

## 2015-03-25 NOTE — Telephone Encounter (Signed)
Pls fax orders I put on your keyboard to [facility] (memory unit) and call one of his daughters to see if they can arrange a 30 min office f/u with me in 7-10 days--for routine f/u plus his potassium was high on recent labs and I adjusted one of his bp meds to try to help this and I want to repeat his lab at that time.-thx

## 2015-03-25 NOTE — Telephone Encounter (Signed)
Reviewed outside lab results from 03/21/15: CBC wnl except mild low Hb (11.5, MCV 90). Na 131, K 5.6, o/w CMET was fine. TSH normal. HbA1c was 6.8% (stable).  Due to elevated K, I sent order to [place] to decrease pt's valsartan from 80mg  qd to 1/2 of 80 mg tab qd.  Will have his family arrange o/v in 1-2 wks to check f/u labs and see him in office for f/u.---PM

## 2015-03-28 NOTE — Telephone Encounter (Signed)
Order faxed.

## 2015-03-30 DIAGNOSIS — Z23 Encounter for immunization: Secondary | ICD-10-CM | POA: Diagnosis not present

## 2015-05-05 DIAGNOSIS — E119 Type 2 diabetes mellitus without complications: Secondary | ICD-10-CM | POA: Diagnosis not present

## 2015-05-08 DIAGNOSIS — N182 Chronic kidney disease, stage 2 (mild): Secondary | ICD-10-CM

## 2015-05-08 DIAGNOSIS — N2889 Other specified disorders of kidney and ureter: Secondary | ICD-10-CM

## 2015-05-08 HISTORY — DX: Chronic kidney disease, stage 2 (mild): N18.2

## 2015-05-08 HISTORY — DX: Other specified disorders of kidney and ureter: N28.89

## 2015-06-01 ENCOUNTER — Telehealth: Payer: Self-pay | Admitting: Family Medicine

## 2015-06-01 NOTE — Telephone Encounter (Signed)
Facility notified patients daughter, [name](POA), that the Prozac 10mg  has worked well since October in curbing his sexual tendencies/desires until just recently.   They have suggested that he increase dose if Dr Anitra Lauth agrees. I notified daughter the Dr Anitra Lauth was out of town and that patient may need an appointment for a increase in medication.  If increase is ok to then order can be faxed to [facility] attn: Lina Sar at [phone].   If he needs an appointment to be further evaluated then daughter needs next Friday between 9:30am and 11am as she will have to have her sister drive up from [facility] to bring her dad in.

## 2015-06-01 NOTE — Telephone Encounter (Signed)
Please advise. Thanks.  

## 2015-06-02 NOTE — Telephone Encounter (Signed)
No need for office visit. OK to increase med as requested. Heather, if you can do this on the EMR without me being there then please do. Otherwise it will have to wait until I get back to town. Let me know. Thx

## 2015-06-06 NOTE — Telephone Encounter (Signed)
Please write out new order for increase of Prozac. Thanks.

## 2015-06-06 NOTE — Telephone Encounter (Signed)
Order for 20mg  fluoxetine tabs written.

## 2015-06-06 NOTE — Telephone Encounter (Signed)
Order faxed. Pts daughter advised and voiced understanding.

## 2015-06-14 ENCOUNTER — Telehealth: Payer: Self-pay | Admitting: Family Medicine

## 2015-06-14 NOTE — Telephone Encounter (Signed)
Please advise. Thanks.  

## 2015-06-14 NOTE — Telephone Encounter (Signed)
Pt needs a new order for fluoxitine 20mg  [employer] faxed [place] at [phone] attn Lina Sar. Insurance will not pay for the rx if it reads tablet - only a capsule.

## 2015-06-15 NOTE — Telephone Encounter (Signed)
New order faxed to number below.

## 2015-06-15 NOTE — Telephone Encounter (Signed)
OK, new rx written and placed on your desk.

## 2015-07-20 ENCOUNTER — Encounter: Payer: Self-pay | Admitting: Family Medicine

## 2015-07-20 ENCOUNTER — Telehealth: Payer: Self-pay | Admitting: *Deleted

## 2015-07-20 DIAGNOSIS — R369 Urethral discharge, unspecified: Secondary | ICD-10-CM | POA: Insufficient documentation

## 2015-07-20 NOTE — Telephone Encounter (Signed)
Randy Cohen called wanting to know if she could get a written order to do a UA CNS. She stated that the nurses that care for pt noticed some abnormal discharge from the penis area. She is unable to take a verbal order. Fax: [phone] Please advise. Thanks.

## 2015-07-20 NOTE — Telephone Encounter (Signed)
Order written and placed on your desk.

## 2015-07-21 NOTE — Telephone Encounter (Signed)
Order faxed and sent to scan °

## 2015-07-25 DIAGNOSIS — Z990 Dependence on aspirator: Secondary | ICD-10-CM | POA: Diagnosis not present

## 2015-07-28 ENCOUNTER — Ambulatory Visit: Payer: Medicare Other | Admitting: Family Medicine

## 2015-08-03 ENCOUNTER — Ambulatory Visit: Payer: Medicare Other | Admitting: Family Medicine

## 2015-08-15 DIAGNOSIS — E119 Type 2 diabetes mellitus without complications: Secondary | ICD-10-CM | POA: Diagnosis not present

## 2015-08-16 ENCOUNTER — Encounter: Payer: Self-pay | Admitting: Family Medicine

## 2015-08-16 ENCOUNTER — Ambulatory Visit (INDEPENDENT_AMBULATORY_CARE_PROVIDER_SITE_OTHER): Payer: Medicare Other | Admitting: Family Medicine

## 2015-08-16 VITALS — BP 129/65 | HR 67 | Temp 97.9°F | Ht 67.5 in | Wt 157.0 lb

## 2015-08-16 DIAGNOSIS — I10 Essential (primary) hypertension: Secondary | ICD-10-CM | POA: Diagnosis not present

## 2015-08-16 DIAGNOSIS — Z0000 Encounter for general adult medical examination without abnormal findings: Secondary | ICD-10-CM

## 2015-08-16 DIAGNOSIS — E118 Type 2 diabetes mellitus with unspecified complications: Secondary | ICD-10-CM | POA: Diagnosis not present

## 2015-08-16 LAB — BASIC METABOLIC PANEL
BUN: 26 mg/dL — ABNORMAL HIGH (ref 6–23)
CO2: 31 mEq/L (ref 19–32)
Calcium: 9.3 mg/dL (ref 8.4–10.5)
Chloride: 93 mEq/L — ABNORMAL LOW (ref 96–112)
Creatinine, Ser: 0.95 mg/dL (ref 0.40–1.50)
GFR: 80.62 mL/min (ref >60.00)
Glucose, Bld: 126 mg/dL — ABNORMAL HIGH (ref 70–99)
POTASSIUM: 5 meq/L (ref 3.5–5.1)
SODIUM: 132 meq/L — AB (ref 135–145)

## 2015-08-16 LAB — HEMOGLOBIN A1C: HEMOGLOBIN A1C: 7 % — AB (ref 4.6–6.5)

## 2015-08-16 NOTE — Progress Notes (Signed)
Pre visit review using our clinic review tool, if applicable. No additional management support is needed unless otherwise documented below in the visit note. 

## 2015-08-16 NOTE — Progress Notes (Signed)
The patient is here for annual Medicare wellness examination and management of other chronic and acute problems.  Pt lives in [place], memory unit.  He has dementia with behavioral disturbance.  Other problems discussed today: DM 2, HTN.  Needs f/u labs.  He has no acute complaints.  Denies burning, tingling, or numbness in feet.  Review of ALF bp's and glucoses are all normal.  Lab Results  Component Value Date   TSH 1.62 01/27/2013   Lab Results  Component Value Date   HGBA1C 6.6* 07/23/2014   Lab Results  Component Value Date   WBC 6.2 01/27/2013   HGB 14.3 01/27/2013   HCT 41.8 01/27/2013   MCV 91.8 01/27/2013   PLT 236.0 01/27/2013   Lab Results  Component Value Date   CREATININE 0.9 11/04/2013   BUN 15 11/04/2013   NA 134* 11/04/2013   K 4.5 11/04/2013   CL 94* 11/04/2013   CO2 31 11/04/2013   Lab Results  Component Value Date   ALT 17 11/04/2013   AST 20 11/04/2013   ALKPHOS 64 11/04/2013   BILITOT 0.6 11/04/2013   Lab Results  Component Value Date   CHOL 199 02/19/2014   Lab Results  Component Value Date   HDL 36.80* 02/19/2014   Lab Results  Component Value Date   LDLCALC 124* 02/19/2014   Lab Results  Component Value Date   TRIG 192.0* 02/19/2014   Lab Results  Component Value Date   CHOLHDL 5 02/19/2014   Lab Results  Component Value Date   PSA 0.05* 07/23/2014   PSA 0.03* 01/27/2013   PSA 0.07* 10/27/2009   EXAM:  Foot exam -  no swelling, tenderness or skin or vascular lesions. Color and temperature is normal. Sensation is intact to monofilament testing except for mild diminished sensation over metatarsal heads on R foot. Peripheral pulses are palpable. Toenails are thick but o/w normal.  Skin is dry/flaky.      AWV DATA The risk factors are reflected in the social history.  The roster of all physicians providing medical care to patient is listed in the Snapshot section of the chart.  Activities of daily living:  The  patient is 100% independent in all ADLs: dressing, toileting, feeding as well as independent mobility.  He needs some assistance with bathing.  He can dress himself and feed himself. He ambulates w/out assistance.  He toilets on his own.  Home safety : N/A--he lives in ALF.  The patient has smoke detectors in the home. They wear seatbelts. No firearms at home ( firearms are present in the home, kept in a safe fashion). There is no violence in the home.   There is no risks for hepatitis, STDs or HIV. There is no history of blood transfusion. They have no travel history to infectious disease endemic areas of the world.  The patient has seen their dentist in the last six month and family is not wanting to pursue this. They have seen their eye doctor in the last year. They deny any hearing difficulty and have not had audiologic testing in the last year.  They do not  have excessive sun exposure. Discussed the need for sun protection: hats, long sleeves and use of sunscreen if there is significant sun exposure.   Diet: the importance of a healthy diet is discussed. They do have a healthy diet.  The patient has a regular exercise program: some light exercise directed by people at [facility], plus he follows  his wife around a lot as she wanders..  The benefits of regular aerobic exercise were discussed.  Depression screen: there are no signs or vegative symptoms of depression- irritability, change in appetite, anhedonia, sadness/tearfullness.  Cognitive assessment: the patient manages all their financial and personal affairs and is actively engaged. They could relate day,date,year and events; recalled 3/3 objects at 3 minutes; performed clock-face test normally.  Reviewed advanced directives with pt today.  The following portions of the patient's history were reviewed and updated as appropriate: allergies, current medications, past family history, past medical history,  past surgical history, past  social history  and problem list.  Vision, hearing, body mass index were assessed and reviewed. /Body mass index is 24.21 kg/(m^2).   During the course of the visit the patient was educated and counseled about appropriate screening and preventive services including :  Annual wellness visit ---doing now. diabetes screening--N/A. colorectal cancer screening--pt/family declines further screening. recommended immunizations (influenza, pneumococcal, Hep B)--Pt has had necessary vaccines. Bone mass measurement: n/A Counseling to prevent tobacco use--N/A Depression screening-today (neg) Glaucoma screening--at eye MD Hepatitis C virus screening: Declines HIV virus screening: Declines Lung cancer screening: pt not a candidate. Medical nutrition therapy: N/A Prostate cancer screening-pt defers further surveillance Screening mammography-N/A Screening pap tests, pelvic exam, and clinical breast exam: N/A Ultrasound screening for AAA: pt not a candidate.  A written plan of action regarding the above screening and preventative services was given to the patient today.  An After Visit Summary was printed and given to the patient.  F/u: 6 mo f/u for chronic illnesses.  Signed:  Crissie Sickles, MD           08/16/2015

## 2015-08-23 ENCOUNTER — Telehealth: Payer: Self-pay | Admitting: *Deleted

## 2015-08-23 NOTE — Telephone Encounter (Signed)
Both labs and Rx have been faxed to Santiago Glad at [facility].

## 2015-08-23 NOTE — Telephone Encounter (Signed)
OK to send labs as requested. Rx written.

## 2015-08-23 NOTE — Telephone Encounter (Signed)
Randy Cohen with [place] [facility] LMOM on 08/23/15 at 10:15am requesting a copy of pts labs done on 08/16/15 and an written Rx for the Sarna Lotion to be applied to skin daily as needed 1 bottle with 2Rf. Fax: [phone] Please advise. Thanks.

## 2015-08-31 ENCOUNTER — Encounter: Payer: Self-pay | Admitting: Family Medicine

## 2015-08-31 ENCOUNTER — Telehealth: Payer: Self-pay | Admitting: Family Medicine

## 2015-08-31 DIAGNOSIS — R2689 Other abnormalities of gait and mobility: Secondary | ICD-10-CM | POA: Insufficient documentation

## 2015-08-31 DIAGNOSIS — M79604 Pain in right leg: Secondary | ICD-10-CM | POA: Insufficient documentation

## 2015-08-31 NOTE — Telephone Encounter (Signed)
Pls call daughter. I don't see any cholesterol medication on his current med list. Also, ask her to be more specific when she says she thinks he is having a gout flare (what joint?  What does it look like?  Is the joint swollen and red and very tender to touch. Let me know-thx

## 2015-08-31 NOTE — Telephone Encounter (Signed)
Spoke to pts daughter and advised her that Santiago Glad from [place] has contacted [place] about this and that a message has been sent by to Dr. Anitra Lauth. She voiced understanding. See message from Santiago Glad.

## 2015-08-31 NOTE — Telephone Encounter (Signed)
Patient's daughter has noticed that her dad is not walking as well as he used to & wants to know if he should go off of his cholestrol medication. She also said she thinks he is having a gout flair & that he has taken medication in the past that was Rx'd by a previous physician that worked well. Can he get an Rx? Please call her if you have any questions

## 2015-08-31 NOTE — Telephone Encounter (Signed)
No meds at this time. I put a rx with some orders on it by your computer.  Pls fax it to his ALF.

## 2015-08-31 NOTE — Telephone Encounter (Signed)
Please advise. Thanks.  

## 2015-08-31 NOTE — Telephone Encounter (Signed)
Spoke to Santiago Glad about pt. She stated that pt recently started limping on the right side and favoring his right knee. She stated that the daughter told her that pt had a history of gout in his right hip. Santiago Glad stated that there in a little swelling in the right knee but no tenderness or redness. She also confirmed that pt is not taking a cholesterol medication. Santiago Glad want to know if Dr. Anitra Lauth would like to order a xray or labs. She stated that they have some one coming out Monday to draw labs and can have it done then. Please fax order to [phone]. Thanks.

## 2015-08-31 NOTE — Telephone Encounter (Signed)
Santiago Glad is calling b/c patient is having trouble ambulating and has a slight limp involving right knee. There is slight discomfort. Santiago Glad would like a call back to discuss with you.

## 2015-08-31 NOTE — Telephone Encounter (Signed)
Left message for Randy Cohen to call back.

## 2015-09-01 DIAGNOSIS — M25569 Pain in unspecified knee: Secondary | ICD-10-CM | POA: Diagnosis not present

## 2015-09-01 DIAGNOSIS — M25559 Pain in unspecified hip: Secondary | ICD-10-CM | POA: Diagnosis not present

## 2015-09-01 NOTE — Telephone Encounter (Signed)
Rx faxed to Santiago Glad at [facility].

## 2015-09-06 DIAGNOSIS — Z8639 Personal history of other endocrine, nutritional and metabolic disease: Secondary | ICD-10-CM | POA: Diagnosis not present

## 2015-09-06 DIAGNOSIS — M79604 Pain in right leg: Secondary | ICD-10-CM | POA: Diagnosis not present

## 2015-09-06 DIAGNOSIS — R2689 Other abnormalities of gait and mobility: Secondary | ICD-10-CM | POA: Diagnosis not present

## 2015-09-14 ENCOUNTER — Encounter: Payer: Self-pay | Admitting: Family Medicine

## 2015-10-07 ENCOUNTER — Ambulatory Visit (INDEPENDENT_AMBULATORY_CARE_PROVIDER_SITE_OTHER): Payer: Self-pay | Admitting: Ophthalmology

## 2015-11-05 DIAGNOSIS — R600 Localized edema: Secondary | ICD-10-CM

## 2015-11-05 DIAGNOSIS — R609 Edema, unspecified: Secondary | ICD-10-CM

## 2015-11-05 HISTORY — DX: Edema, unspecified: R60.9

## 2015-11-05 HISTORY — DX: Localized edema: R60.0

## 2015-11-09 ENCOUNTER — Encounter: Payer: Self-pay | Admitting: Family Medicine

## 2015-11-09 ENCOUNTER — Ambulatory Visit: Payer: Medicare Other | Admitting: Family Medicine

## 2015-11-09 DIAGNOSIS — Z0289 Encounter for other administrative examinations: Secondary | ICD-10-CM

## 2015-11-17 ENCOUNTER — Telehealth: Payer: Self-pay | Admitting: *Deleted

## 2015-11-17 ENCOUNTER — Ambulatory Visit (INDEPENDENT_AMBULATORY_CARE_PROVIDER_SITE_OTHER): Payer: Medicare Other | Admitting: Family Medicine

## 2015-11-17 ENCOUNTER — Encounter: Payer: Self-pay | Admitting: Family Medicine

## 2015-11-17 VITALS — BP 140/75 | HR 79 | Temp 98.7°F | Resp 16 | Ht 67.5 in | Wt 185.5 lb

## 2015-11-17 DIAGNOSIS — R635 Abnormal weight gain: Secondary | ICD-10-CM

## 2015-11-17 DIAGNOSIS — R011 Cardiac murmur, unspecified: Secondary | ICD-10-CM | POA: Diagnosis not present

## 2015-11-17 DIAGNOSIS — R609 Edema, unspecified: Secondary | ICD-10-CM | POA: Diagnosis not present

## 2015-11-17 LAB — CBC WITH DIFFERENTIAL/PLATELET
BASOS ABS: 0 10*3/uL (ref 0.0–0.1)
BASOS PCT: 0.3 % (ref 0.0–3.0)
EOS ABS: 0.2 10*3/uL (ref 0.0–0.7)
EOS PCT: 2.7 % (ref 0.0–5.0)
HEMATOCRIT: 36.3 % — AB (ref 39.0–52.0)
HEMOGLOBIN: 11.9 g/dL — AB (ref 13.0–17.0)
LYMPHS PCT: 28.8 % (ref 12.0–46.0)
Lymphs Abs: 1.7 10*3/uL (ref 0.7–4.0)
MCHC: 32.9 g/dL (ref 30.0–36.0)
MCV: 85.2 fl (ref 78.0–100.0)
MONO ABS: 0.7 10*3/uL (ref 0.1–1.0)
Monocytes Relative: 11.9 % (ref 3.0–12.0)
NEUTROS ABS: 3.3 10*3/uL (ref 1.4–7.7)
Neutrophils Relative %: 56.3 % (ref 43.0–77.0)
Platelets: 245 10*3/uL (ref 150.0–400.0)
RBC: 4.26 Mil/uL (ref 4.22–5.81)
RDW: 15.8 % — ABNORMAL HIGH (ref 11.5–15.5)
WBC: 5.9 10*3/uL (ref 4.0–10.5)

## 2015-11-17 LAB — POCT URINALYSIS DIPSTICK
Bilirubin, UA: NEGATIVE
GLUCOSE UA: NEGATIVE
Leukocytes, UA: NEGATIVE
Nitrite, UA: NEGATIVE
Protein, UA: 30
Spec Grav, UA: 1.025
UROBILINOGEN UA: 0.2
pH, UA: 5.5

## 2015-11-17 NOTE — Progress Notes (Signed)
Pre visit review using our clinic review tool, if applicable. No additional management support is needed unless otherwise documented below in the visit note. 

## 2015-11-17 NOTE — Telephone Encounter (Signed)
Randy Cohen has been scheduled for his echocardiogram on 7/18 at 4pm.

## 2015-11-17 NOTE — Progress Notes (Signed)
OFFICE VISIT  11/17/2015   CC:  Chief Complaint  Patient presents with  . Edema    BLE   HPI:    Patient is a 82 y.o. Caucasian male who presents for "swelling in legs". Since last o/v here a wt gain of 28 lbs is noted. He is here with a driver today who has no history.   Patient is poor historian.  He denies hx of falls.  He ambulates w/out assistance.  He denies pain anywhere.  Says breathing feels normal. He says he feels like he has gained some wt in legs and less so in abdominal area. Appetite is good.  Says he sometimes adds salt to his food, sometimes doesn't.    Past Medical History  Diagnosis Date  . Macular degeneration of left eye   . Allergic rhinitis   . Hypertension   . Diabetes mellitus   . Hiatal hernia   . Diverticulosis of colon   . Benign prostatic hypertrophy   . Carcinoma of prostate ([place])   . DJD (degenerative joint disease)   . History of gout   . Anxiety   . Glaucoma of left eye   . Hyphema of left eye 06/2012  . History of peptic ulcer   . Hyperlipidemia   . Dementia arising in the senium and presenium 2015    Past Surgical History  Procedure Laterality Date  . 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 Appendectomy    . Tonsillectomy and adenoidectomy    . Right knee surgery      Laceration from a saw  . Left shoulder surgery      shoulder replacement  . Left cataract surgery  10/2008    Dr. Charise Killian  . Nasal sinus surgery  remote past    for sinus polyps  . Cardiovascular stress test  2005    Normal  . Colonoscopy  2007    diverticulosis  . Skin cancer excision  2011    Basal cell  . Le arterial duplex doppler  12/2013    Normal (Dr. Amalia Hailey)    Outpatient Prescriptions Prior to Visit  Medication Sig Dispense Refill  . alum & mag hydroxide-simeth (MAALOX/MYLANTA) 200-200-20 MG/5ML suspension Take 30 mLs by mouth every 6 (six) hours as needed for indigestion or heartburn.    Marland Kitchen artificial tears (LACRILUBE) OINT  ophthalmic ointment Place into both eyes 3 (three) times daily. 3.5 g 1  . aspirin EC 81 MG tablet Take 81 mg by mouth daily.    . Difluprednate (DUREZOL) 0.05 % EMUL Place 1 drop into the left eye daily as needed (pain).     Marland Kitchen diltiazem (CARDIZEM CD) 120 MG 24 hr capsule Take 1 capsule (120 mg total) by mouth daily. 30 capsule 11  . divalproex (DEPAKOTE) 250 MG DR tablet Take 1 tablet (250 mg total) by mouth 2 (two) times daily. 60 tablet 3  . FLUoxetine (PROZAC) 20 MG capsule Take 1 capsule by mouth daily.    Marland Kitchen loperamide (IMODIUM) 2 MG capsule Take by mouth as needed for diarrhea or loose stools.    . metFORMIN (GLUCOPHAGE) 500 MG tablet Take 500 mg by mouth 2 (two) times daily with a meal.    . Multiple Vitamins-Minerals (PRESERVISION AREDS PO) Take 1 tablet by mouth 2 (two) times daily.    . Skin Protectants, Misc. (MINERIN) CREA Apply 1 application topically daily.    . valsartan (DIOVAN) 40 MG tablet Take 1 tablet by mouth  daily.    . mupirocin ointment (BACTROBAN) 2 % Apply to opened skin areas on right ear tid x 10d (Patient not taking: Reported on 11/17/2015) 15 g 0   No facility-administered medications prior to visit.    Allergies  Allergen Reactions  . Aricept [Donepezil Hcl]     Decreased appetite  . Atenolol     REACTION: INTOL Atenolol w/ bradycardia  . Codeine Phosphate     REACTION: nausea    ROS As per HPI  PE: Blood pressure 140/75, pulse 79, temperature 98.7 F (37.1 C), temperature source Oral, resp. rate 16, height 5' 7.5" (1.715 m), weight 185 lb 8 oz (84.142 kg), SpO2 93 %. Gen: Alert, well appearing.  Patient is oriented to person.  He is pleasant and answers questions but he does not know who I am.  This is his baseline MS. CV: RRR, 2/6 systolic murmur heard best at LUSB.  No rub or gallop. Chest is clear, no wheezing or rales. Normal symmetric air entry throughout both lung fields. No chest wall deformities or tenderness. ABD: soft, NT, ND, BS normal.  No  hepatospenomegaly or mass.  No bruits. EXT: no clubbing or cyanosis.  He has 2-3 + edema bilat in LL's starting a few inches below the knees and extending down into feet.  Some diffuse hyperkeratosis is noted but no erythema/rash/tenderness or warmth. No asymmetry of calves.   LABS:  Lab Results  Component Value Date   TSH 1.62 01/27/2013   Lab Results  Component Value Date   WBC 6.2 01/27/2013   HGB 14.3 01/27/2013   HCT 41.8 01/27/2013   MCV 91.8 01/27/2013   PLT 236.0 01/27/2013   Lab Results  Component Value Date   CREATININE 0.95 08/16/2015   BUN 26* 08/16/2015   NA 132* 08/16/2015   K 5.0 08/16/2015   CL 93* 08/16/2015   CO2 31 08/16/2015   Lab Results  Component Value Date   ALT 17 11/04/2013   AST 20 11/04/2013   ALKPHOS 64 11/04/2013   BILITOT 0.6 11/04/2013   Lab Results  Component Value Date   CHOL 199 02/19/2014   Lab Results  Component Value Date   HDL 36.80* 02/19/2014   Lab Results  Component Value Date   LDLCALC 124* 02/19/2014   Lab Results  Component Value Date   TRIG 192.0* 02/19/2014   Lab Results  Component Value Date   CHOLHDL 5 02/19/2014   Lab Results  Component Value Date   HGBA1C 7.0* 08/16/2015   CC UA today: trace ketones, SG 1.025, trace lysed blood, 30 mg/dl protein, otherwise negative.  IMPRESSION AND PLAN:  1) Recent onset bilat LE edema. LE venous insufficiency edema + possibly right heart failure.  Doubt bilat LE DVT. Minimal proteinuria on spot UA today.  Some of his wt gain is likely from long term use of depakote. He certainly does not show any signs of pulm edema or ascites. Will check CBC and CMET. Wrote rx for compression stockings for pt today. Given his dementia +living in the memory unit at [place], it will be difficult to get him to eat low Na diet and elevate his legs.   If lytes/cr ok, will start lasix at low dose.  I will also check a 2D echo. No med changes made today.  An After Visit  Summary was printed and given to the patient.  FOLLOW UP: Return in about 2 weeks (around 12/01/2015) for f/u edema (30 min).  Signed:  Crissie Sickles, MD           11/17/2015

## 2015-11-17 NOTE — Telephone Encounter (Signed)
Noted  

## 2015-11-18 ENCOUNTER — Other Ambulatory Visit: Payer: Self-pay | Admitting: Family Medicine

## 2015-11-18 ENCOUNTER — Telehealth: Payer: Self-pay | Admitting: Family Medicine

## 2015-11-18 ENCOUNTER — Other Ambulatory Visit: Payer: Self-pay | Admitting: *Deleted

## 2015-11-18 DIAGNOSIS — F0391 Unspecified dementia with behavioral disturbance: Secondary | ICD-10-CM

## 2015-11-18 DIAGNOSIS — F03918 Unspecified dementia, unspecified severity, with other behavioral disturbance: Secondary | ICD-10-CM

## 2015-11-18 LAB — COMPREHENSIVE METABOLIC PANEL
ALBUMIN: 4.1 g/dL (ref 3.5–5.2)
ALT: 10 U/L (ref 0–53)
AST: 13 U/L (ref 0–37)
Alkaline Phosphatase: 59 U/L (ref 39–117)
BILIRUBIN TOTAL: 0.3 mg/dL (ref 0.2–1.2)
BUN: 31 mg/dL — AB (ref 6–23)
CALCIUM: 9.2 mg/dL (ref 8.4–10.5)
CHLORIDE: 97 meq/L (ref 96–112)
CO2: 28 mEq/L (ref 19–32)
CREATININE: 1.1 mg/dL (ref 0.40–1.50)
GFR: 68.03 mL/min (ref >60.00)
Glucose, Bld: 182 mg/dL — ABNORMAL HIGH (ref 70–99)
Potassium: 5 mEq/L (ref 3.5–5.1)
SODIUM: 137 meq/L (ref 135–145)
TOTAL PROTEIN: 7.2 g/dL (ref 6.0–8.3)

## 2015-11-18 MED ORDER — FUROSEMIDE 20 MG PO TABS: 20.0000 mg | ORAL_TABLET | Freq: Every day | ORAL | Status: DC

## 2015-11-18 NOTE — Telephone Encounter (Signed)
OK, will cancel echo and will order hospice consult.

## 2015-11-18 NOTE — Telephone Encounter (Signed)
Can you cancel this patient's echocardiogram that has already been scheduled?-thx

## 2015-11-18 NOTE — Telephone Encounter (Signed)
Patient's daughter is requesting CB about her dad's plan of care. She has POA.

## 2015-11-18 NOTE — Telephone Encounter (Signed)
Pts daughter advised and voiced understanding, okay per DPR. 

## 2015-11-18 NOTE — Telephone Encounter (Signed)
Spoke to pts daughter and she stated that she reviewed pts mychart and also noted that we ordered a 2Decho. She stated that she is not sure if this is what her father would want. She stated that they don't want to take any measures to prolong life, they want to do comfort measures. She stated that she is concerned about her fathers quality of life. She stated that even if there are findings they most likely will not proceed with any treatment/surgery. She also requested a referral to hospice consult for pt. Please advise. Thanks.

## 2015-11-22 ENCOUNTER — Other Ambulatory Visit (HOSPITAL_COMMUNITY): Payer: Self-pay

## 2015-11-22 NOTE — Telephone Encounter (Signed)
Echo canceled--noted.

## 2015-11-22 NOTE — Telephone Encounter (Signed)
Yes

## 2015-12-01 ENCOUNTER — Ambulatory Visit (INDEPENDENT_AMBULATORY_CARE_PROVIDER_SITE_OTHER): Payer: Medicare Other | Admitting: Family Medicine

## 2015-12-01 ENCOUNTER — Encounter: Payer: Self-pay | Admitting: Family Medicine

## 2015-12-01 VITALS — BP 156/69 | HR 68 | Temp 98.1°F | Resp 16 | Ht 67.5 in | Wt 184.0 lb

## 2015-12-01 DIAGNOSIS — R609 Edema, unspecified: Secondary | ICD-10-CM | POA: Diagnosis not present

## 2015-12-01 LAB — BASIC METABOLIC PANEL
BUN: 28 mg/dL — ABNORMAL HIGH (ref 6–23)
CHLORIDE: 93 meq/L — AB (ref 96–112)
CO2: 32 mEq/L (ref 19–32)
CREATININE: 1.12 mg/dL (ref 0.40–1.50)
Calcium: 9.2 mg/dL (ref 8.4–10.5)
GFR: 66.62 mL/min (ref >60.00)
Glucose, Bld: 211 mg/dL — ABNORMAL HIGH (ref 70–99)
POTASSIUM: 4.8 meq/L (ref 3.5–5.1)
Sodium: 133 mEq/L — ABNORMAL LOW (ref 135–145)

## 2015-12-01 MED ORDER — LORAZEPAM 0.5 MG PO TABS: 1.0000 mg | ORAL_TABLET | Freq: Two times a day (BID) | ORAL | 2 refills | Status: DC | PRN

## 2015-12-01 NOTE — Progress Notes (Signed)
Pre visit review using our clinic review tool, if applicable. No additional management support is needed unless otherwise documented below in the visit note. 

## 2015-12-01 NOTE — Progress Notes (Signed)
OFFICE VISIT  12/01/2015   CC:  Chief Complaint  Patient presents with  . Follow-up    edema     HPI:    Patient is a 82 y.o. Caucasian male who presents for 2 week f/u LE edema. Since last visit his family has decided that palliative care would be what he would want for this stage in his life, so we d/c'd our plan to get an echocardiogram.  I started him on daily lasix and he needs a f/u BMET today. His weight is the same as when I saw him 2 weeks ago.  They started his compression stockings at [facility].  Has R ear lesion noted for at least the last months and he picks at it and it bleeds occasionally.  No pain.  No itching.   Past Medical History:  Diagnosis Date  . Allergic rhinitis   . Anxiety   . Benign prostatic hypertrophy   . Carcinoma of prostate ([place])   . Dementia arising in the senium and presenium 2015  . Diabetes mellitus   . Diverticulosis of colon   . DJD (degenerative joint disease)   . Glaucoma of left eye   . Hiatal hernia   . History of gout   . History of peptic ulcer   . Hyperlipidemia   . Hypertension   . Hyphema of left eye 06/2012  . Macular degeneration of left eye     Past Surgical History:  Procedure Laterality Date  . adenocarcinoma of the prostate with seed implantation  2006  . APPENDECTOMY    . CARDIOVASCULAR STRESS TEST  2005   Normal  . COLONOSCOPY  2007   diverticulosis  . LE Arterial duplex doppler  12/2013   Normal (Dr. Amalia Hailey)  . left cataract surgery  10/2008   Dr. Charise Killian  . left shoulder surgery     shoulder replacement  . NASAL SINUS SURGERY  remote past   for sinus polyps  . right knee surgery     Laceration from a saw  . SKIN CANCER EXCISION  2011   Basal cell  . TONSILLECTOMY AND ADENOIDECTOMY      Outpatient Medications Prior to Visit  Medication Sig Dispense Refill  . alum & mag hydroxide-simeth (MAALOX/MYLANTA) 200-200-20 MG/5ML suspension Take 30 mLs by mouth every 6 (six) hours as needed for  indigestion or heartburn.    Marland Kitchen artificial tears (LACRILUBE) OINT ophthalmic ointment Place into both eyes 3 (three) times daily. 3.5 g 1  . aspirin EC 81 MG tablet Take 81 mg by mouth daily.    . Difluprednate (DUREZOL) 0.05 % EMUL Place 1 drop into the left eye daily as needed (pain).     Marland Kitchen diltiazem (CARDIZEM CD) 120 MG 24 hr capsule Take 1 capsule (120 mg total) by mouth daily. 30 capsule 11  . divalproex (DEPAKOTE) 250 MG DR tablet Take 1 tablet (250 mg total) by mouth 2 (two) times daily. 60 tablet 3  . FLUoxetine (PROZAC) 20 MG capsule Take 1 capsule by mouth daily.    . furosemide (LASIX) 20 MG tablet Take 1 tablet (20 mg total) by mouth daily. 30 tablet 1  . loperamide (IMODIUM) 2 MG capsule Take by mouth as needed for diarrhea or loose stools.    . metFORMIN (GLUCOPHAGE) 500 MG tablet Take 500 mg by mouth 2 (two) times daily with a meal.    . Multiple Vitamins-Minerals (PRESERVISION AREDS PO) Take 1 tablet by mouth 2 (two) times daily.    Marland Kitchen  Skin Protectants, Misc. (MINERIN) CREA Apply 1 application topically daily.    . valsartan (DIOVAN) 40 MG tablet Take 1 tablet by mouth daily.     No facility-administered medications prior to visit.     Allergies  Allergen Reactions  . Aricept [Donepezil Hcl]     Decreased appetite  . Atenolol     REACTION: INTOL Atenolol w/ bradycardia  . Codeine Phosphate     REACTION: nausea    ROS As per HPI  PE: Blood pressure (!) 156/69, pulse 68, temperature 98.1 F (36.7 C), temperature source Oral, resp. rate 16, height 5' 7.5" (1.715 m), weight 184 lb (83.5 kg), SpO2 95 %. Gen: Alert, well appearing.  Patient is oriented to person. Right anterior region of ear with 2 cm lobulated, irregular, crusted/scabby nodule. LE's: 2-3 + pitting edema from just below the knees down into feet bilat.  Somemild flaky hyperkeratosis of LE skin is noted diffusely.  No erythema or weeping or tenderness.    LABS:    Chemistry      Component Value  Date/Time   NA 137 11/17/2015 1420   K 5.0 11/17/2015 1420   CL 97 11/17/2015 1420   CO2 28 11/17/2015 1420   BUN 31 (H) 11/17/2015 1420   CREATININE 1.10 11/17/2015 1420      Component Value Date/Time   CALCIUM 9.2 11/17/2015 1420   ALKPHOS 59 11/17/2015 1420   AST 13 11/17/2015 1420   ALT 10 11/17/2015 1420   BILITOT 0.3 11/17/2015 1420     IMPRESSION AND PLAN:  1) Peripheral edema: likely LE venous insufficiency.  Unsure if some component of RV failure may be contributing b/c we decided not to get echo after last visit, which I think is the right decision. He appears comfortable. Check BMET today.  Adjust lasix up if able.  2) Right ear lesion; suspect BCC or SCC: discussed possible referral to derm for excision and his caregiver today wanted to discuss this with pt's daughter ([place]) before making a decision about this.  An After Visit Summary was printed and given to the patient.   FOLLOW UP: Return in about 2 months (around 02/01/2016) for f/u LE edema.   Signed:  Crissie Sickles, MD           12/01/2015

## 2015-12-02 ENCOUNTER — Other Ambulatory Visit: Payer: Self-pay | Admitting: *Deleted

## 2015-12-02 ENCOUNTER — Encounter: Payer: Self-pay | Admitting: *Deleted

## 2015-12-02 DIAGNOSIS — R609 Edema, unspecified: Secondary | ICD-10-CM | POA: Insufficient documentation

## 2015-12-02 DIAGNOSIS — R600 Localized edema: Secondary | ICD-10-CM | POA: Insufficient documentation

## 2015-12-08 ENCOUNTER — Other Ambulatory Visit: Payer: Self-pay

## 2015-12-12 DIAGNOSIS — R609 Edema, unspecified: Secondary | ICD-10-CM | POA: Diagnosis not present

## 2015-12-16 ENCOUNTER — Encounter: Payer: Self-pay | Admitting: Family Medicine

## 2015-12-19 ENCOUNTER — Encounter: Payer: Self-pay | Admitting: Family Medicine

## 2016-02-15 ENCOUNTER — Other Ambulatory Visit: Payer: Self-pay

## 2016-02-15 MED ORDER — FUROSEMIDE 20 MG PO TABS: 20.0000 mg | ORAL_TABLET | Freq: Every day | ORAL | 0 refills | Status: DC

## 2016-02-15 NOTE — Telephone Encounter (Signed)
Refill sent.

## 2016-02-16 ENCOUNTER — Telehealth: Payer: Self-pay | Admitting: Family Medicine

## 2016-02-16 NOTE — Telephone Encounter (Signed)
It was changed to 40mg  qd at the end of July 2017.  Ok to continue with furosemide 40mg  once daily.-thx

## 2016-02-16 NOTE — Telephone Encounter (Signed)
Randy Cohen with [facility] calling to inquire about furosemide (LASIX) 20 MG tablet prescription that was sent in on yesterday.  According to her records patient has been taking Lasix 40mg  tablet, needs to know when dosage was changed to 20mg .  Please call back to follow up.

## 2016-02-17 MED ORDER — FUROSEMIDE 40 MG PO TABS: 40.0000 mg | ORAL_TABLET | Freq: Every day | ORAL | 3 refills | Status: AC

## 2016-02-17 NOTE — Telephone Encounter (Signed)
Reordered furosemide 40 mg as specified by Dr. Anitra Lauth.  Santiago Glad at [facility] notified and faxed copy of medication list.

## 2016-03-14 ENCOUNTER — Encounter: Payer: Self-pay | Admitting: Family Medicine

## 2016-05-04 ENCOUNTER — Telehealth: Payer: Self-pay | Admitting: *Deleted

## 2016-05-04 NOTE — Telephone Encounter (Signed)
Cindy with [facility] called to get an order to d/c compression socks. Pt family no longer wants these. Pts daughter called earlier and spoke with Diane stating that pt was misplacing them. Order will need to be faxed to [phone]. Please advise. Thanks.

## 2016-05-15 NOTE — Telephone Encounter (Signed)
Rx written and placed on your desk.

## 2016-05-15 NOTE — Telephone Encounter (Signed)
Order faxed to [facility].

## 2016-11-20 DIAGNOSIS — C44212 Basal cell carcinoma of skin of right ear and external auricular canal: Secondary | ICD-10-CM | POA: Diagnosis not present

## 2016-12-25 DIAGNOSIS — C44212 Basal cell carcinoma of skin of right ear and external auricular canal: Secondary | ICD-10-CM | POA: Diagnosis not present

## 2016-12-28 ENCOUNTER — Emergency Department (HOSPITAL_COMMUNITY)
Admission: EM | Admit: 2016-12-28 | Discharge: 2016-12-28 | Disposition: A | Discharge status: Home / Self Care | Payer: Medicare Other | Attending: Emergency Medicine | Admitting: Emergency Medicine

## 2016-12-28 ENCOUNTER — Encounter (HOSPITAL_COMMUNITY): Payer: Self-pay | Admitting: Nurse Practitioner

## 2016-12-28 DIAGNOSIS — E119 Type 2 diabetes mellitus without complications: Secondary | ICD-10-CM | POA: Insufficient documentation

## 2016-12-28 DIAGNOSIS — Z4801 Encounter for change or removal of surgical wound dressing: Secondary | ICD-10-CM | POA: Diagnosis not present

## 2016-12-28 DIAGNOSIS — R402411 Glasgow coma scale score 13-15, in the field [EMT or ambulance]: Secondary | ICD-10-CM | POA: Diagnosis not present

## 2016-12-28 DIAGNOSIS — I10 Essential (primary) hypertension: Secondary | ICD-10-CM | POA: Diagnosis not present

## 2016-12-28 DIAGNOSIS — R4182 Altered mental status, unspecified: Secondary | ICD-10-CM | POA: Insufficient documentation

## 2016-12-28 DIAGNOSIS — Z7984 Long term (current) use of oral hypoglycemic drugs: Secondary | ICD-10-CM | POA: Diagnosis not present

## 2016-12-28 DIAGNOSIS — Z5189 Encounter for other specified aftercare: Secondary | ICD-10-CM

## 2016-12-28 DIAGNOSIS — Z87891 Personal history of nicotine dependence: Secondary | ICD-10-CM | POA: Diagnosis not present

## 2016-12-28 LAB — CBG MONITORING, ED: GLUCOSE-CAPILLARY: 175 mg/dL — AB (ref 65–99)

## 2016-12-28 MED ORDER — BACITRACIN ZINC 500 UNIT/GM EX OINT
1.0000 "application " | TOPICAL_OINTMENT | Freq: Two times a day (BID) | CUTANEOUS | Status: DC
  Administered 2016-12-28: 1 via TOPICAL
  Filled 2016-12-28: qty 1.8

## 2016-12-28 MED ORDER — BACITRACIN ZINC 500 UNIT/GM EX OINT
1.0000 | TOPICAL_OINTMENT | Freq: Two times a day (BID) | CUTANEOUS | 0 refills | Status: DC
Start: 2016-12-28 — End: 2017-07-30

## 2016-12-28 MED ORDER — DOXYCYCLINE HYCLATE 100 MG PO CAPS: 100.0000 mg | ORAL_CAPSULE | Freq: Two times a day (BID) | ORAL | 0 refills | Status: DC

## 2016-12-28 MED ORDER — DOXYCYCLINE HYCLATE 100 MG PO TABS
100.0000 mg | ORAL_TABLET | Freq: Once | ORAL | Status: AC
  Administered 2016-12-28: 100 mg via ORAL
  Filled 2016-12-28: qty 1

## 2016-12-28 NOTE — Discharge Instructions (Signed)
STOP taking levaquin and START taking doxycycline to treat skin infection. Keep wound as clean and dry as possible. Apply Bacitracin twice daily and use water or peroxide to clean wound. Return to ER if any worsening redness, swelling, or drainage, or if any fevers or altered behavior.

## 2016-12-28 NOTE — ED Triage Notes (Signed)
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 Scant drainage noted. Pt asleep at this time.

## 2016-12-28 NOTE — ED Notes (Signed)
Bed: WA07 Expected date:  Expected time:  Means of arrival:  Comments: 

## 2016-12-29 NOTE — ED Provider Notes (Signed)
[facility] DEPT Provider Note   CSN: [id number] Arrival date & time: 12/28/16  1943     History   Chief Complaint Chief Complaint  Patient presents with  . Wound Check    Right Ear    HPI Randy Cohen is a 83 y.o. male.  83yo M w/ PMH including dementia, CKD, T2DM, basal cell skin cancer who p/w R ear surgical wound and altered behavior. Family reports that the patient had excision of a basal cell cancer on his right ear last week by his dermatologist. This evening at his nursing facility his caregivers noted that he did not eat dinner which is unusual for him. They were concerned about abnormal behavior and sent him in for further evaluation. Family states that he does pick at the surgical site frequently and they have tried to keep it bandaged but he always pulls the bandages off. No fevers. He was given levaquin by dermatologist after surgery and he has had 3 doses.  LEVEL 5 CAVEAT DUE TO DEMENTIA   The history is provided by the nursing home and a relative.  Wound Check     Past Medical History:  Diagnosis Date  . Allergic rhinitis   . Anxiety   . Benign prostatic hypertrophy   . Carcinoma of prostate ([place])   . Chronic renal insufficiency, stage II (mild) 2017   GFR 60s  . Dementia arising in the senium and presenium 2015  . Diverticulosis of colon   . DJD (degenerative joint disease)   . Glaucoma of left eye   . Hiatal hernia   . History of gout   . History of peptic ulcer   . Hyperlipidemia   . Hypertension   . Hyphema of left eye 06/2012  . Macular degeneration of left eye   . Peripheral edema 11/2015   Suspect chronic venous insufficiency  . Type 2 diabetes with complication [facility])     Patient Active Problem List   Diagnosis Date Noted  . Peripheral edema 12/02/2015  . Limp 08/31/2015  . Right leg pain 08/31/2015  . Penile discharge 07/20/2015  . High risk medication use 03/18/2015  . Diabetes mellitus without complication ([place]) 37/85/8850  .  Encounter for examination for admission to assisted living facility 02/21/2014  . Dementia arising in the senium and presenium   . Memory loss or impairment 11/20/2013  . Leg weakness, bilateral 11/19/2013  . HTN (hypertension), benign 11/04/2013  . Memory loss 11/04/2013  . Skin exam, screening for cancer 11/04/2013  . UNSPECIFIED VISUAL LOSS 10/22/2008  . ALLERGIC RHINITIS 10/22/2008  . BENIGN PROSTATIC HYPERTROPHY, HX OF 10/25/2007  . CARCINOMA, PROSTATE 10/23/2007  . HYPERCHOLESTEROLEMIA 10/23/2007  . GOUT 10/23/2007  . ANXIETY 10/23/2007  . HIATAL HERNIA 10/23/2007  . DIVERTICULOSIS OF COLON 10/23/2007  . DEGENERATIVE JOINT DISEASE 10/23/2007  . HYPERTENSION 04/08/2007    Past Surgical History:  Procedure Laterality Date  . adenocarcinoma of the prostate with seed implantation  2006  . APPENDECTOMY    . CARDIOVASCULAR STRESS TEST  2005   Normal  . COLONOSCOPY  2007   diverticulosis  . LE Arterial duplex doppler  12/2013   Normal (Dr. Amalia Hailey)  . left cataract surgery  10/2008   Dr. Charise Killian  . left shoulder surgery     shoulder replacement  . NASAL SINUS SURGERY  remote past   for sinus polyps  . right knee surgery     Laceration from a saw  . SKIN CANCER EXCISION  2011  Basal cell  . TONSILLECTOMY AND ADENOIDECTOMY         Home Medications    Prior to Admission medications   Medication Sig Start Date End Date Taking? Authorizing Provider  acetaminophen (TYLENOL) 500 MG tablet Take 500 mg by mouth every 6 (six) hours as needed for moderate pain, fever or headache.   Yes [provider]  alum & mag hydroxide-simeth (MAALOX/MYLANTA) 200-200-20 MG/5ML suspension Take 30 mLs by mouth every 6 (six) hours as needed for indigestion or heartburn.   Yes [provider]  aspirin EC 81 MG tablet Take 81 mg by mouth daily.   Yes [provider]  diltiazem (TIAZAC) 120 MG 24 hr capsule Take 120 mg by mouth daily.   Yes [provider]    divalproex (DEPAKOTE) 250 MG DR tablet Take 1 tablet (250 mg total) by mouth 2 (two) times daily. 09/08/14  Yes McGowen, Adrian Blackwater, MD  FLUoxetine (PROZAC) 20 MG capsule Take 1 capsule by mouth daily. 08/13/15  Yes [provider]  furosemide (LASIX) 40 MG tablet Take 1 tablet (40 mg total) by mouth daily. 02/17/16  Yes McGowen, Adrian Blackwater, MD  hydrogen peroxide 3 % external solution Apply 1 application topically daily as needed (wound).   Yes [provider]  levofloxacin (LEVAQUIN) 500 MG tablet Take 500 mg by mouth daily. 5 Days.   Yes [provider]  loperamide (IMODIUM) 2 MG capsule Take 2 mg by mouth daily as needed for diarrhea or loose stools.    Yes [provider]  magnesium hydroxide (MILK OF MAGNESIA) 400 MG/5ML suspension Take 30 mLs by mouth daily as needed for mild constipation.   Yes [provider]  meloxicam (MOBIC) 7.5 MG tablet Take 1 tablet by mouth daily as needed for pain.  09/06/15  Yes [provider]  metFORMIN (GLUCOPHAGE) 500 MG tablet Take 500 mg by mouth 2 (two) times daily with a meal.   Yes [provider]  Multiple Vitamins-Minerals (PRESERVISION AREDS PO) Take 1 tablet by mouth 2 (two) times daily.   Yes [provider]  polyvinyl alcohol (LIQUIFILM TEARS) 1.4 % ophthalmic solution Place 2 drops into both eyes 3 (three) times daily.   Yes [provider]  protein supplement shake (PREMIER PROTEIN) LIQD Take 2 oz by mouth 3 (three) times daily with meals.   Yes [provider]  valsartan (DIOVAN) 40 MG tablet Take 1 tablet by mouth daily. 08/02/15  Yes [provider]  artificial tears (LACRILUBE) OINT ophthalmic ointment Place into both eyes 3 (three) times daily. Patient not taking: Reported on 12/28/2016 05/22/14   Dorie Rank, MD  bacitracin ointment Apply 1 application topically 2 (two) times daily. 12/28/16   Little, Wenda Overland, MD  diltiazem (CARDIZEM CD) 120 MG 24 hr  capsule Take 1 capsule (120 mg total) by mouth daily. Patient not taking: Reported on 12/28/2016 02/22/14   Tammi Sou, MD  doxycycline (VIBRAMYCIN) 100 MG capsule Take 1 capsule (100 mg total) by mouth 2 (two) times daily. 12/28/16   Little, Wenda Overland, MD  LORazepam (ATIVAN) 0.5 MG tablet Take 2 tablets (1 mg total) by mouth 2 (two) times daily as needed for anxiety. Patient not taking: Reported on 12/28/2016 12/01/15   Tammi Sou, MD    Family History Family History  Problem Relation Age of Onset  . Colon cancer Brother   . Macular degeneration Brother   . Heart Problems Brother  pacemaker  . Breast cancer Sister     Social History Social History  Substance Use Topics  . Smoking status: Former Smoker    Packs/day: 3.00    Years: 25.00    Types: Cigarettes    Quit date: 05/07/1958  . Smokeless tobacco: Never Used  . Alcohol use No     Allergies   Aricept [donepezil hcl]; Atenolol; and Codeine phosphate   Review of Systems Review of Systems  Unable to perform ROS: Dementia     Physical Exam Updated Vital Signs BP (!) 141/67 (BP Location: Left Arm)   Pulse (!) 53   Temp 97.9 F (36.6 C) (Oral)   Resp 14   SpO2 94%   Physical Exam  Constitutional: He appears well-developed and well-nourished. No distress.  Asleep, comfortable  HENT:  Head: Normocephalic.  Left Ear: External ear normal.  Sutures in place on R ear lobe extending to pinna, mild surrounding redness, some bloody crusted drainage on sutures  Eyes: Conjunctivae are normal.  Neck: Neck supple.  Cardiovascular: Normal rate, regular rhythm and normal heart sounds.   Pulmonary/Chest: Effort normal and breath sounds normal.  Abdominal: He exhibits no distension.  Musculoskeletal: He exhibits no edema.  Neurological:  Asleep, moving all 4 extremities equally; answers "yes" or "no" but unable to answer questions/follow commands  Skin: Skin is warm.  See photo  Nursing note and  vitals reviewed.      ED Treatments / Results  Labs (all labs ordered are listed, but only abnormal results are displayed) Labs Reviewed  CBG MONITORING, ED - Abnormal; Notable for the following:       Result Value   Glucose-Capillary 175 (*)    All other components within normal limits    EKG  EKG Interpretation None       Radiology No results found.  Procedures Procedures (including critical care time)  Medications Ordered in ED Medications  bacitracin ointment 1 application (1 application Topical Given 12/28/16 2208)  doxycycline (VIBRA-TABS) tablet 100 mg (100 mg Oral Given 12/28/16 2208)     Initial Impression / Assessment and Plan / ED Course  I have reviewed the triage vital signs and the nursing notes.  Pertinent labs  that were available during my care of the patient were reviewed by me and considered in my medical decision making (see chart for details).    PT sent from facility for wound check, they were concerned because he wasn't eating dinner tonight. Afebrile, comfortable. Some crusted drainage on exam. Some of his sutures are running sutures and I do not want to open any as this will compromise the entire lobe repair. I cleaned area and applied peroxide. Because he's been on antibiotics that are not ideal for skin flora coverage, I feel switching him to doxycycline for better coverage may offer some benefit. He has an appointment in a few days with his dermatologist. Family has stated he is at his neurologic baseline. They feel comfortable with plan and understand return precautions. Patient discharged in satisfactory condition.  Final Clinical Impressions(s) / ED Diagnoses   Final diagnoses:  Visit for wound check    New Prescriptions Discharge Medication List as of 12/28/2016 10:18 PM    START taking these medications   Details  bacitracin ointment Apply 1 application topically 2 (two) times daily., Starting Fri 12/28/2016, Print    doxycycline  (VIBRAMYCIN) 100 MG capsule Take 1 capsule (100 mg total) by mouth 2 (two) times daily., Starting Fri 12/28/2016, Print  Little, Wenda Overland, MD 12/29/16 [phone]

## 2017-03-06 ENCOUNTER — Other Ambulatory Visit: Payer: Self-pay | Admitting: *Deleted

## 2017-03-06 MED ORDER — DIVALPROEX SODIUM 250 MG PO DR TAB: 250.0000 mg | DELAYED_RELEASE_TABLET | Freq: Two times a day (BID) | ORAL | 3 refills | Status: DC

## 2017-03-06 NOTE — Telephone Encounter (Signed)
Received refill request from [facility] for divalproex. Please advise. Thanks.

## 2017-03-06 NOTE — Telephone Encounter (Signed)
Rx faxed

## 2017-03-13 ENCOUNTER — Other Ambulatory Visit: Payer: Self-pay | Admitting: *Deleted

## 2017-03-13 MED ORDER — FLUOXETINE HCL 20 MG PO CAPS: 20.0000 mg | ORAL_CAPSULE | Freq: Every day | ORAL | 3 refills | Status: DC

## 2017-03-13 NOTE — Telephone Encounter (Signed)
Fax from [facility] requesting refill for fluoxetine. Please advise. Thanks.

## 2017-03-27 ENCOUNTER — Other Ambulatory Visit: Payer: Self-pay

## 2017-03-27 MED ORDER — DIVALPROEX SODIUM 250 MG PO DR TAB: 250.0000 mg | DELAYED_RELEASE_TABLET | Freq: Two times a day (BID) | ORAL | 3 refills | Status: DC

## 2017-03-27 NOTE — Telephone Encounter (Signed)
Recent prescription to [facility]

## 2017-07-30 ENCOUNTER — Ambulatory Visit (INDEPENDENT_AMBULATORY_CARE_PROVIDER_SITE_OTHER): Payer: Medicare Other | Admitting: Family Medicine

## 2017-07-30 ENCOUNTER — Encounter: Payer: Self-pay | Admitting: Family Medicine

## 2017-07-30 VITALS — BP 131/79 | HR 75 | Temp 98.8°F | Resp 16 | Ht 71.0 in | Wt 180.0 lb

## 2017-07-30 DIAGNOSIS — Z79899 Other long term (current) drug therapy: Secondary | ICD-10-CM | POA: Diagnosis not present

## 2017-07-30 DIAGNOSIS — I10 Essential (primary) hypertension: Secondary | ICD-10-CM | POA: Diagnosis not present

## 2017-07-30 DIAGNOSIS — F528 Other sexual dysfunction not due to a substance or known physiological condition: Secondary | ICD-10-CM

## 2017-07-30 DIAGNOSIS — E119 Type 2 diabetes mellitus without complications: Secondary | ICD-10-CM

## 2017-07-30 DIAGNOSIS — L2489 Irritant contact dermatitis due to other agents: Secondary | ICD-10-CM

## 2017-07-30 DIAGNOSIS — F0391 Unspecified dementia with behavioral disturbance: Secondary | ICD-10-CM

## 2017-07-30 MED ORDER — HYPROMELLOSE (GONIOSCOPIC) 2.5 % OP SOLN: 2.0000 [drp] | Freq: Three times a day (TID) | OPHTHALMIC | 12 refills | Status: DC

## 2017-07-30 MED ORDER — DIVALPROEX SODIUM 250 MG PO DR TAB: DELAYED_RELEASE_TABLET | ORAL | 3 refills | Status: DC

## 2017-07-30 MED ORDER — PREDNISONE 5 MG PO TABS: ORAL_TABLET | ORAL | 0 refills | Status: DC

## 2017-07-30 NOTE — Progress Notes (Signed)
OFFICE VISIT  07/30/2017   CC:  Chief Complaint  Patient presents with  . Rash     HPI:    Patient is a 84 y.o. Caucasian male who presents for f/u visit.  He is accompanied by his 2 daughters. Family unable to bring him in more often than once per year. I have not seen him since 11/2015.    Lives in [facility], memory unit. Problems:   1) rash.  Pt is not active, lays in bed much of the day.  Has started getting a splotchy red rash that itches a lot, primarily located on back. No pain or fevers.  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 Fluoxetine 20mg  qd had been helpful up until recently.  He is trying to get in bed with wife in evenings wanting/trying to have sex with her.    3) HTN: reviewed bp's and HRs from NH nurse records today: these are consistently <130/80.  4) DM 2: taking metformin.  Glucoses reviewed on nursing notes today and these range from 130s up to high 100s.  Past Medical History:  Diagnosis Date  . Allergic rhinitis   . Anxiety   . Benign prostatic hypertrophy   . Carcinoma of prostate ([place])   . Chronic renal insufficiency, stage II (mild) 2017   GFR 60s  . Dementia arising in the senium and presenium 2015  . Diverticulosis of colon   . DJD (degenerative joint disease)   . Glaucoma of left eye   . Hiatal hernia   . History of gout   . History of peptic ulcer   . Hyperlipidemia   . Hypertension   . Hyphema of left eye 06/2012  . Macular degeneration of left eye   . Peripheral edema 11/2015   Suspect chronic venous insufficiency  . Type 2 diabetes with complication [facility])     Past Surgical History:  Procedure Laterality Date  . adenocarcinoma of the prostate with seed implantation  2006  . APPENDECTOMY    . CARDIOVASCULAR STRESS TEST  2005   Normal  . COLONOSCOPY  2007   diverticulosis  . LE Arterial duplex doppler  12/2013   Normal (Dr. Amalia Hailey)  . left cataract surgery   10/2008   Dr. Charise Killian  . left shoulder surgery     shoulder replacement  . NASAL SINUS SURGERY  remote past   for sinus polyps  . right knee surgery     Laceration from a saw  . SKIN CANCER EXCISION  2011   Basal cell  . TONSILLECTOMY AND ADENOIDECTOMY      Outpatient Medications Prior to Visit  Medication Sig Dispense Refill  . acetaminophen (TYLENOL) 500 MG tablet Take 500 mg by mouth every 6 (six) hours as needed for moderate pain, fever or headache.    Marland Kitchen alum & mag hydroxide-simeth (MAALOX/MYLANTA) 200-200-20 MG/5ML suspension Take 30 mLs by mouth every 6 (six) hours as needed for indigestion or heartburn.    Marland Kitchen aspirin EC 81 MG tablet Take 81 mg by mouth daily.    Marland Kitchen diltiazem (CARDIZEM CD) 120 MG 24 hr capsule Take 1 capsule (120 mg total) by mouth daily. 30 capsule 11  . FLUoxetine (PROZAC) 20 MG capsule Take 1 capsule (20 mg total) daily by mouth. 90 capsule 3  . furosemide (LASIX) 40 MG tablet Take 1 tablet (40 mg total) by mouth daily. 30 tablet 3  . loperamide (IMODIUM) 2 MG capsule Take 2 mg by  mouth daily as needed for diarrhea or loose stools.     . magnesium hydroxide (MILK OF MAGNESIA) 400 MG/5ML suspension Take 30 mLs by mouth daily as needed for mild constipation.    . meloxicam (MOBIC) 7.5 MG tablet Take 1 tablet by mouth daily as needed for pain.     . meloxicam (MOBIC) 7.5 MG tablet Take 7.5 mg by mouth daily.    . metFORMIN (GLUCOPHAGE) 500 MG tablet Take 500 mg by mouth 2 (two) times daily with a meal.    . Multiple Vitamins-Minerals (PRESERVISION AREDS PO) Take 1 tablet by mouth 2 (two) times daily.    . polyvinyl alcohol (LIQUIFILM TEARS) 1.4 % ophthalmic solution Place 2 drops into both eyes 3 (three) times daily.    . protein supplement shake (PREMIER PROTEIN) LIQD Take 2 oz by mouth 3 (three) times daily with meals.    . valsartan (DIOVAN) 40 MG tablet Take 1 tablet by mouth daily.    . divalproex (DEPAKOTE) 250 MG DR tablet Take 1 tablet (250 mg total) by mouth 2  (two) times daily. 180 tablet 3  . artificial tears (LACRILUBE) OINT ophthalmic ointment Place into both eyes 3 (three) times daily. (Patient not taking: Reported on 12/28/2016) 3.5 g 1  . hydrogen peroxide 3 % external solution Apply 1 application topically daily as needed (wound).    . LORazepam (ATIVAN) 0.5 MG tablet Take 2 tablets (1 mg total) by mouth 2 (two) times daily as needed for anxiety. (Patient not taking: Reported on 12/28/2016) 30 tablet 2  . bacitracin ointment Apply 1 application topically 2 (two) times daily. (Patient not taking: Reported on 07/30/2017) 14 g 0  . diltiazem (TIAZAC) 120 MG 24 hr capsule Take 120 mg by mouth daily.    Marland Kitchen doxycycline (VIBRAMYCIN) 100 MG capsule Take 1 capsule (100 mg total) by mouth 2 (two) times daily. (Patient not taking: Reported on 07/30/2017) 14 capsule 0  . levofloxacin (LEVAQUIN) 500 MG tablet Take 500 mg by mouth daily. 5 Days.     No facility-administered medications prior to visit.     Allergies  Allergen Reactions  . Aricept [Donepezil Hcl]     Decreased appetite  . Atenolol     REACTION: INTOL Atenolol w/ bradycardia  . Codeine Phosphate     REACTION: nausea    ROS As per HPI  PE: Blood pressure 131/79, pulse 75, temperature 98.8 F (37.1 C), temperature source Temporal, resp. rate 16, height 5\' 11"  (1.803 m), weight 180 lb (81.6 kg), SpO2 96 %. Gen: Alert, well appearing.  Patient is smiling, answers some yes/no questions but is essentially oriented to person only. He is smiling, attends well, pleasant affect.  Follows instructions during exam. CV: RRR, no m/r/g.   LUNGS: CTA bilat, nonlabored resps, good aeration in all lung fields. EXT: no clubbing, cyanosis, or edema.  Neuro: CN 2-12 intact bilaterally, strength 5/5 in proximal and distal upper extremities and lower extremities bilaterally. No tremor.  FNF intact bilat.   SKIN: entire back is covered with small pinkish maculopapular rash, +blanchable.  No pustules or  vesicles.  No tenderness.  +mild warmth.  Left arm and lower legs with some excoriated pinkish scab lesions.  LABS:  None today  IMPRESSION AND PLAN:  [place], memory unit patient--> greater than 1 yr office follow up.  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  If this does not help significantly, will increase fluoxetine to 30mg  qd. Daughters do note he has trouble getting to sleep lots of nights, used to have ativan prn available but NH has not been giving this. I told them I want to hold off on renewing this med at this time due to potential adverse response/side effect potential in elderly demented patients. We'll have a depakote trough done after he has been on his 500mg  depakote qhs for a week.  At that time will also check CBC w/diff, CMET, and HbA1c.  2) Rash: suspect irritant dermatitis (sweat, pressure, heat) from sitting back or lying supine too long. No sign of infection.  He has some neurotic excoriations on L arm and a few on LL's. I'll do trial of prednisone 20mg  qd x 5d, then 10mg  qd x 5d, then 5mg  qd x 5d. Wrote order requesting nurse at [facility] to call with report of how his rash is doing in 3 wks and we'll determine if office f/u to recheck this is needed.  3) DM 2: fair control. Goal with him is A1c between 8 and 9%.  4) HTN: The current medical regimen is effective;  continue present plan and medications. Lytes/cr as stated in #1 above.  Spent 40 min with pt and caregivers  today, with >50% of this time spent in counseling and care coordination regarding the above problems.  An After Visit Summary was printed and given to the patient.  FOLLOW UP: Return for 1 yr: order for labs (including depakote trough) written on rx and handed to pt's daughters..  Signed:  Crissie Sickles, MD           07/30/2017

## 2017-07-31 ENCOUNTER — Telehealth: Payer: Self-pay | Admitting: *Deleted

## 2017-07-31 DIAGNOSIS — D649 Anemia, unspecified: Secondary | ICD-10-CM | POA: Diagnosis not present

## 2017-07-31 DIAGNOSIS — E119 Type 2 diabetes mellitus without complications: Secondary | ICD-10-CM | POA: Diagnosis not present

## 2017-07-31 DIAGNOSIS — Z79899 Other long term (current) drug therapy: Secondary | ICD-10-CM | POA: Diagnosis not present

## 2017-07-31 LAB — BASIC METABOLIC PANEL
BUN: 23 — AB (ref 4–21)
GLUCOSE: 200
POTASSIUM: 5 (ref 3.4–5.3)
SODIUM: 133 — AB (ref 137–147)

## 2017-07-31 LAB — HEMOGLOBIN A1C: Hemoglobin A1C: 10

## 2017-07-31 LAB — CBC AND DIFFERENTIAL
HCT: 40 — AB (ref 41–53)
Hemoglobin: 12.8 — AB (ref 13.5–17.5)
Platelets: 233 (ref 150–399)
WBC: 5.2

## 2017-07-31 NOTE — Telephone Encounter (Signed)
Done

## 2017-07-31 NOTE — Telephone Encounter (Signed)
Can you send a message back on the fax form asking if the gel or ointment form is available?-thx

## 2017-07-31 NOTE — Telephone Encounter (Signed)
Received fax from pts pharmacy stating that [place] 2.5% drops are on backorder with no release date. Please advise. Thanks.

## 2017-09-05 ENCOUNTER — Telehealth: Payer: Self-pay | Admitting: Family Medicine

## 2017-09-05 ENCOUNTER — Encounter: Payer: Self-pay | Admitting: Family Medicine

## 2017-09-05 MED ORDER — PREDNISONE 5 MG PO TABS: ORAL_TABLET | ORAL | 0 refills | Status: AC

## 2017-09-05 NOTE — Telephone Encounter (Signed)
Message left for Randy Cohen to return call.

## 2017-09-05 NOTE — Telephone Encounter (Signed)
OK, prednisone eRx'd. OK to get random valproic acid level rather than trough.-thx

## 2017-09-05 NOTE — Telephone Encounter (Signed)
Copied from [place] [id number]. Topic: Quick Communication - See Telephone Encounter >> Sep 05, 2017  9:51 AM Cleaster Corin, NT wrote: CRM for notification. See Telephone encounter for: 09/05/17.  Santiago Glad calling from [employer] medication for rash that pt. Has previously had. Also has questions for nurse  Santiago Glad can be reached at [phone]

## 2017-09-05 NOTE — Telephone Encounter (Signed)
Santiago Glad from [facility] stated that medication Mr. Randy Cohen took for rash in March resolved issue but rash has returned and requesting a refill if possible.  Also can patient have a regular Depokote level drawn instead of trough. Please advise.

## 2017-09-06 ENCOUNTER — Other Ambulatory Visit: Payer: Self-pay | Admitting: Family Medicine

## 2017-09-06 NOTE — Telephone Encounter (Signed)
Detailed message left on [employer].

## 2017-09-06 NOTE — Telephone Encounter (Signed)
[facility]  RF request for divalproex LOV: 07/30/17 Next ov: None Last written: Rx done on 07/30/17 was done as no print.  Please advise. Thanks.

## 2017-09-24 ENCOUNTER — Telehealth: Payer: Self-pay | Admitting: Family Medicine

## 2017-09-24 NOTE — Telephone Encounter (Signed)
Reviewed labs from 07/31/17 done by [place] ([place] resident). His glucose was 200, A1c was 10%. Cr 1.03-->GFR 23ml/min.  Pls arrange for pt to get increase in his metformin to 1000 mg twice per day.  Needs o/v to see me and we'll repeat A1c --last week of June. Make sure [place] is checking his glucose once daily---fasting.--thx

## 2017-09-25 ENCOUNTER — Encounter: Payer: Self-pay | Admitting: *Deleted

## 2017-09-25 ENCOUNTER — Other Ambulatory Visit: Payer: Self-pay | Admitting: *Deleted

## 2017-09-25 DIAGNOSIS — R569 Unspecified convulsions: Secondary | ICD-10-CM | POA: Diagnosis not present

## 2017-09-25 DIAGNOSIS — Z79899 Other long term (current) drug therapy: Secondary | ICD-10-CM | POA: Diagnosis not present

## 2017-09-25 LAB — LAB REPORT - SCANNED: VALPROIC ACID LVL: 29

## 2017-09-25 MED ORDER — METFORMIN HCL 1000 MG PO TABS: 1000.0000 mg | ORAL_TABLET | Freq: Two times a day (BID) | ORAL | 1 refills | Status: DC

## 2017-09-25 NOTE — Telephone Encounter (Signed)
Orders faxed and Rx for metformin eRxed to [facility] and Rx faxed to [facility].

## 2017-09-25 NOTE — Telephone Encounter (Signed)
SW nurse at [facility], she was advised of orders and requested that these orders be faxed to [phone]. Apt made for 10/28/17 at 9:30am.   Left message for pts daughter to call back.

## 2017-09-25 NOTE — Telephone Encounter (Signed)
Pts daughter advised and voiced understanding. She stated that she will most likely have to change that apt made on 10/28/17. She is unable to bring pt by herself, so she will have to check w/ her sister and husband to see who can help her and when.

## 2017-10-04 ENCOUNTER — Telehealth: Payer: Self-pay | Admitting: Family Medicine

## 2017-10-04 ENCOUNTER — Encounter: Payer: Self-pay | Admitting: Family Medicine

## 2017-10-04 NOTE — Telephone Encounter (Signed)
[place] notified and verbalized understanding.

## 2017-10-04 NOTE — Telephone Encounter (Signed)
Pls notify pt's assisted living facility/NH that his depakote level was fine. Continue current depakote (valproic acid) dosing.-thx

## 2017-10-28 ENCOUNTER — Telehealth: Payer: Self-pay | Admitting: *Deleted

## 2017-10-28 ENCOUNTER — Ambulatory Visit: Payer: Self-pay | Admitting: Family Medicine

## 2017-10-28 ENCOUNTER — Ambulatory Visit: Payer: Medicare Other | Admitting: Family Medicine

## 2017-10-28 MED ORDER — IVERMECTIN 3 MG PO TABS: ORAL_TABLET | ORAL | 0 refills | Status: DC

## 2017-10-28 NOTE — Telephone Encounter (Signed)
Pts daughter called stating that pts living facility has an out break of scabies and they are treating all residents. She is wanting to know if Dr. Anitra Lauth will approve treatment for pt.    Per Dr. Anitra Lauth okay to send Rx/order for ivermectin 3mg  tablet take 1 tablet by mouth x 1 day then repeat 3mg  dose in 7 days #2 w/ 0RF.  Order/Rx faxed.

## 2017-11-04 DIAGNOSIS — B86 Scabies: Secondary | ICD-10-CM

## 2017-11-04 HISTORY — DX: Scabies: B86

## 2017-11-12 ENCOUNTER — Telehealth: Payer: Self-pay | Admitting: Family Medicine

## 2017-11-12 NOTE — Telephone Encounter (Signed)
Patient's daughter, Tessie Fass, calling to request something for the patient's itching as well as a higher dosage of antibiotic for scabies.  CB#: [place], [place].

## 2017-11-12 NOTE — Telephone Encounter (Signed)
Copied from [place] [id number]. Topic: General - Other >> Nov 12, 2017  9:23 AM Keene Breath wrote: Reason for CRM: Danielle from [facility] called to request a higher dosage of medication for patient infected with scabies.  Patient did get the first prescription filled but according to the patient's weight, the dosage should be higher.  Facility where patient lives, also stated that patient still had signs of the infection.  Please advise if patient needs more medication called in.  CB# [phone].

## 2017-11-13 MED ORDER — FEXOFENADINE HCL 30 MG PO TBDP: ORAL_TABLET | ORAL | 0 refills | Status: AC

## 2017-11-13 MED ORDER — IVERMECTIN 3 MG PO TABS: ORAL_TABLET | ORAL | 0 refills | Status: DC

## 2017-11-13 NOTE — Telephone Encounter (Signed)
It appears Dr. Anitra Lauth took care of it.

## 2017-11-13 NOTE — Telephone Encounter (Signed)
Allegra and a higher dose of ivermectin eRx'd to [facility] [place] [place] just now.

## 2017-11-13 NOTE — Telephone Encounter (Signed)
Patient's daughter advised.  RX was to go to [facility] in [place].  Daughter will contact pharmacy to see if they can do transfer if not she is to contact me and I'll call and fix.

## 2017-11-13 NOTE — Telephone Encounter (Signed)
Per patient's daughter, pharmacy recommends 15 - 18 mg of invermectin dose each time for the scabies?  Please advise.

## 2017-11-13 NOTE — Telephone Encounter (Signed)
Dr. Raoul Pitch,  Can you assist with this rx so patient does not have to wait for Dr. Isla Pence return?

## 2017-11-14 NOTE — Telephone Encounter (Signed)
Verified dose w/ Dr Anitra Lauth, dose is correct based on patient's last weight of 180lbs.  Advised patient's daughter, Tessie Fass.  Okay per DPR.  She said thank you!

## 2017-11-14 NOTE — Telephone Encounter (Signed)
Noted  

## 2017-11-24 ENCOUNTER — Other Ambulatory Visit: Payer: Self-pay | Admitting: Family Medicine

## 2017-11-27 NOTE — Telephone Encounter (Signed)
SW pts daughter Tessie Fass, she stated that [facility] called her and told her that pt needs to be seen by provider for his skin problem, it has gotten worse. Pt has been seen by dermatology in the past for skin cancer so she called [facility] and got pt an apt with Dr. Elvera Lennox on 11/28/17. She wanted to see if it was okay to cancel the apt w/ Dr. Anitra Lauth. I advised her that it was and to call back when pt is healed up to schedule apt for routine f/u. She voiced understanding.

## 2017-11-27 NOTE — Telephone Encounter (Signed)
Noted  

## 2017-11-27 NOTE — Telephone Encounter (Signed)
Pts daughter is calling in wanting to speak with Lattie Haw about her fathers skin    Cb# [phone]

## 2017-12-04 ENCOUNTER — Ambulatory Visit: Payer: Self-pay | Admitting: Family Medicine

## 2017-12-05 DIAGNOSIS — D485 Neoplasm of uncertain behavior of skin: Secondary | ICD-10-CM | POA: Diagnosis not present

## 2017-12-05 DIAGNOSIS — L0101 Non-bullous impetigo: Secondary | ICD-10-CM | POA: Diagnosis not present

## 2017-12-05 DIAGNOSIS — B86 Scabies: Secondary | ICD-10-CM | POA: Diagnosis not present

## 2017-12-05 DIAGNOSIS — L011 Impetiginization of other dermatoses: Secondary | ICD-10-CM | POA: Diagnosis not present

## 2017-12-05 DIAGNOSIS — R21 Rash and other nonspecific skin eruption: Secondary | ICD-10-CM | POA: Diagnosis not present

## 2017-12-23 ENCOUNTER — Encounter: Payer: Self-pay | Admitting: Family Medicine

## 2017-12-27 ENCOUNTER — Telehealth: Payer: Self-pay | Admitting: Family Medicine

## 2018-01-02 ENCOUNTER — Encounter: Payer: Self-pay | Admitting: Family Medicine

## 2018-01-08 ENCOUNTER — Ambulatory Visit (INDEPENDENT_AMBULATORY_CARE_PROVIDER_SITE_OTHER): Payer: Medicare Other | Admitting: Family Medicine

## 2018-01-08 ENCOUNTER — Encounter: Payer: Self-pay | Admitting: Family Medicine

## 2018-01-08 VITALS — BP 138/75 | HR 71 | Temp 97.2°F | Resp 16 | Ht 67.0 in | Wt 163.0 lb

## 2018-01-08 DIAGNOSIS — Z79899 Other long term (current) drug therapy: Secondary | ICD-10-CM | POA: Diagnosis not present

## 2018-01-08 DIAGNOSIS — F0391 Unspecified dementia with behavioral disturbance: Secondary | ICD-10-CM

## 2018-01-08 DIAGNOSIS — E119 Type 2 diabetes mellitus without complications: Secondary | ICD-10-CM | POA: Diagnosis not present

## 2018-01-08 DIAGNOSIS — I10 Essential (primary) hypertension: Secondary | ICD-10-CM | POA: Diagnosis not present

## 2018-01-08 DIAGNOSIS — Z8619 Personal history of other infectious and parasitic diseases: Secondary | ICD-10-CM | POA: Diagnosis not present

## 2018-01-08 DIAGNOSIS — A4902 Methicillin resistant Staphylococcus aureus infection, unspecified site: Secondary | ICD-10-CM

## 2018-01-08 DIAGNOSIS — B9562 Methicillin resistant Staphylococcus aureus infection as the cause of diseases classified elsewhere: Secondary | ICD-10-CM

## 2018-01-08 DIAGNOSIS — L089 Local infection of the skin and subcutaneous tissue, unspecified: Secondary | ICD-10-CM

## 2018-01-08 LAB — CBC WITH DIFFERENTIAL/PLATELET
BASOS ABS: 0.1 10*3/uL (ref 0.0–0.1)
BASOS PCT: 1.5 % (ref 0.0–3.0)
EOS ABS: 0.2 10*3/uL (ref 0.0–0.7)
Eosinophils Relative: 2.2 % (ref 0.0–5.0)
HCT: 39.4 % (ref 39.0–52.0)
HEMOGLOBIN: 12.9 g/dL — AB (ref 13.0–17.0)
LYMPHS PCT: 20.5 % (ref 12.0–46.0)
Lymphs Abs: 1.4 10*3/uL (ref 0.7–4.0)
MCHC: 32.7 g/dL (ref 30.0–36.0)
MCV: 85 fl (ref 78.0–100.0)
MONO ABS: 0.7 10*3/uL (ref 0.1–1.0)
Monocytes Relative: 9.4 % (ref 3.0–12.0)
Neutro Abs: 4.7 10*3/uL (ref 1.4–7.7)
Neutrophils Relative %: 66.4 % (ref 43.0–77.0)
Platelets: 222 10*3/uL (ref 150.0–400.0)
RBC: 4.63 Mil/uL (ref 4.22–5.81)
RDW: 15.4 % (ref 11.5–15.5)
WBC: 7 10*3/uL (ref 4.0–10.5)

## 2018-01-08 LAB — COMPREHENSIVE METABOLIC PANEL
ALBUMIN: 4.3 g/dL (ref 3.5–5.2)
ALT: 11 U/L (ref 0–53)
AST: 14 U/L (ref 0–37)
Alkaline Phosphatase: 63 U/L (ref 39–117)
BILIRUBIN TOTAL: 0.3 mg/dL (ref 0.2–1.2)
BUN: 23 mg/dL (ref 6–23)
CHLORIDE: 95 meq/L — AB (ref 96–112)
CO2: 35 mEq/L — ABNORMAL HIGH (ref 19–32)
CREATININE: 1.24 mg/dL (ref 0.40–1.50)
Calcium: 9.5 mg/dL (ref 8.4–10.5)
GFR: 58.94 mL/min — ABNORMAL LOW (ref >60.00)
Glucose, Bld: 195 mg/dL — ABNORMAL HIGH (ref 70–99)
Potassium: 4.3 mEq/L (ref 3.5–5.1)
SODIUM: 138 meq/L (ref 135–145)
Total Protein: 7.4 g/dL (ref 6.0–8.3)

## 2018-01-08 LAB — HEMOGLOBIN A1C: HEMOGLOBIN A1C: 8.7 % — AB (ref 4.6–6.5)

## 2018-01-08 NOTE — Telephone Encounter (Signed)
Copied from [place] [id number]. Topic: General - Other >> Jan 08, 2018  1:55 PM Bea Graff, NT wrote: Reason for CRM: Pts daughter would like a call to see if her dads CPE went ok today. And what may have been said.

## 2018-01-08 NOTE — Telephone Encounter (Signed)
SW Dr. Anitra Lauth, he stated that pts exam went well today, skin looks good, no changes were made and some labs were ordered.   Pts daughter advised and voiced understanding.

## 2018-01-08 NOTE — Progress Notes (Signed)
Office Note 01/08/2018  CC:  Chief Complaint  Patient presents with  . Annual Exam    HPI:  Randy Cohen is a 84 y.o. White male with severe dementia who is here with [facility] caregiver for CPE, and 6 mo f/u DM 2, HTN, and check of his skin. Relatively recently he had scabies, then the rash was persisting in some areas, esp where he has habitually picked it, and he was seen by Derm MD--one of the areas was superinfected with MRSA-->doxy course x 3 wks finished 12/31/17.   Still picking/itching some but VASTLY improved per caregiver with him today from NH.    Review of NH notes today: BPs 120s/75 avg, HR avg 70s. Fasting gluc avg 160, range 130-204  Living in memory unit at [facility], same room as wife.   Past Medical History:  Diagnosis Date  . Allergic rhinitis   . Anxiety   . Benign prostatic hypertrophy   . Carcinoma of prostate ([place])   . Chronic renal insufficiency, stage II (mild) 2017   GFR 60s  . Dementia arising in the senium and presenium 2015  . Diverticulosis of colon   . DJD (degenerative joint disease)   . Glaucoma of left eye   . Hiatal hernia   . History of gout   . History of peptic ulcer   . Hyperlipidemia   . Hypertension   . Hyphema of left eye 06/2012  . Macular degeneration of left eye   . MRSA (methicillin resistant staph aureus) culture positive    Skin bx of scabies rash: impetiginization (sensitive to doxy)  . Peripheral edema 11/2015   Suspect chronic venous insufficiency  . Scabies 11/2017   skin (shave) bx + for remnant of mite/scabies at derm 12/05/17  . Type 2 diabetes with complication [facility])     Past Surgical History:  Procedure Laterality Date  . adenocarcinoma of the prostate with seed implantation  2006  . APPENDECTOMY    . CARDIOVASCULAR STRESS TEST  2005   Normal  . COLONOSCOPY  2007   diverticulosis  . LE Arterial duplex doppler  12/2013   Normal (Dr. Amalia Hailey)  . left cataract surgery  10/2008   Dr. Charise Killian  . left  shoulder surgery     shoulder replacement  . NASAL SINUS SURGERY  remote past   for sinus polyps  . right knee surgery     Laceration from a saw  . SKIN CANCER EXCISION  2011   Basal cell  . TONSILLECTOMY AND ADENOIDECTOMY      Family History  Problem Relation Age of Onset  . Colon cancer Brother   . Macular degeneration Brother   . Heart Problems Brother        pacemaker  . Breast cancer Sister     Social History   Socioeconomic History  . Marital status: Married    Spouse name: joann Zobrist  . Number of children: 6  . Years of education: 8  . Highest education level: Not on file  Occupational History  . Not on file  Social Needs  . Financial resource strain: Not on file  . Food insecurity:    Worry: Not on file    Inability: Not on file  . Transportation needs:    Medical: Not on file    Non-medical: Not on file  Tobacco Use  . Smoking status: Former Smoker    Packs/day: 3.00    Years: 25.00    Pack years: 75.00  Types: Cigarettes    Last attempt to quit: 05/07/1958    Years since quitting: 59.7  . Smokeless tobacco: Never Used  Substance and Sexual Activity  . Alcohol use: No  . Drug use: No  . Sexual activity: Not on file  Lifestyle  . Physical activity:    Days per week: Not on file    Minutes per session: Not on file  . Stress: Not on file  Relationships  . Social connections:    Talks on phone: Not on file    Gets together: Not on file    Attends religious service: Not on file    Active member of club or organization: Not on file    Attends meetings of clubs or organizations: Not on file    Relationship status: Not on file  . Intimate partner violence:    Fear of current or ex partner: Not on file    Emotionally abused: Not on file    Physically abused: Not on file    Forced sexual activity: Not on file  Other Topics Concern  . Not on file  Social History Narrative   Married Mechele Claude) , 6 children.     Pt lives in [place], memory  unit.  Pt's wife has alzheimers dz and is also living at [facility].   Orig from [place].   Quit smoking around age 12.  No alcohol.   Patient is retired.   Patient has a 8th grade education.   Patient is right-handed.   Patient drinks two cups of coffee daily, one Pepsi/tea per day.    Outpatient Medications Prior to Visit  Medication Sig Dispense Refill  . acetaminophen (TYLENOL) 500 MG tablet Take 500 mg by mouth every 4 (four) hours as needed for moderate pain, fever or headache.     Marland Kitchen alum & mag hydroxide-simeth (MAALOX/MYLANTA) 200-200-20 MG/5ML suspension Take 30 mLs by mouth every 6 (six) hours as needed for indigestion or heartburn.    Marland Kitchen aspirin EC 81 MG tablet Take 81 mg by mouth daily.    . camphor-menthol (SARNA) lotion Apply 1 application topically daily.    . carboxymethylcellulose (REFRESH PLUS) 0.5 % SOLN 2 drops 3 (three) times daily.    Marland Kitchen diltiazem (CARDIZEM CD) 120 MG 24 hr capsule Take 1 capsule (120 mg total) by mouth daily. 30 capsule 11  . divalproex (DEPAKOTE) 250 MG DR tablet TAKE 2 TABS (500MG ) BY MOUTH IN THE EVENING 60 tablet 6  . FLUoxetine (PROZAC) 20 MG capsule Take 1 capsule (20 mg total) daily by mouth. 90 capsule 3  . furosemide (LASIX) 40 MG tablet Take 1 tablet (40 mg total) by mouth daily. 30 tablet 3  . guaiFENesin (ROBITUSSIN) 100 MG/5ML liquid Take 10 mLs by mouth 4 (four) times daily as needed for cough.    . loperamide (IMODIUM) 2 MG capsule Take 2 mg by mouth daily as needed for diarrhea or loose stools.     . magnesium hydroxide (MILK OF MAGNESIA) 400 MG/5ML suspension Take 30 mLs by mouth daily as needed for mild constipation.    . meloxicam (MOBIC) 7.5 MG tablet Take 1 tablet by mouth daily as needed for pain.     . metFORMIN (GLUCOPHAGE) 1000 MG tablet Take 1 tablet (1,000 mg total) by mouth 2 (two) times daily with a meal. 60 tablet 1  . Multiple Vitamins-Minerals (PRESERVISION AREDS PO) Take 1 tablet by mouth 2 (two) times daily.    Marland Kitchen  neomycin-bacitracin-polymyxin (NEOSPORIN) ointment Apply  1 application topically as needed for wound care.    . Nutritional Supplements (NUTRITIONAL SHAKE) LIQD Take 1 each by mouth 3 (three) times daily.    . valsartan (DIOVAN) 40 MG tablet Take 1 tablet by mouth daily.    Marland Kitchen artificial tears (LACRILUBE) OINT ophthalmic ointment Place into both eyes 3 (three) times daily. (Patient not taking: Reported on 12/28/2016) 3.5 g 1  . fexofenadine (ALLEGRA ODT) 30 MG disintegrating tablet 1 tab po bid x 15 days (Patient not taking: Reported on 01/08/2018) 30 tablet 0  . hydrogen peroxide 3 % external solution Apply 1 application topically daily as needed (wound).    . hydroxypropyl methylcellulose / hypromellose (ISOPTO TEARS / GONIOVISC) 2.5 % ophthalmic solution Place 2 drops into both eyes 3 (three) times daily. (Patient not taking: Reported on 01/08/2018) 15 mL 12  . ivermectin (STROMECTOL) 3 MG TABS tablet Take 2 tablets by mouth x 1 day, then repeat 6mg  dose in 7 days (Patient not taking: Reported on 01/08/2018) 4 tablet 0  . LORazepam (ATIVAN) 0.5 MG tablet Take 2 tablets (1 mg total) by mouth 2 (two) times daily as needed for anxiety. (Patient not taking: Reported on 12/28/2016) 30 tablet 2  . meloxicam (MOBIC) 7.5 MG tablet Take 7.5 mg by mouth daily.    . metFORMIN (GLUCOPHAGE) 1000 MG tablet TAKE 1 TABLET BY MOUTH 2 TIMES DAILY WITH A MEAL 60 tablet 5  . polyvinyl alcohol (LIQUIFILM TEARS) 1.4 % ophthalmic solution Place 2 drops into both eyes 3 (three) times daily.    . predniSONE (DELTASONE) 5 MG tablet 4 tabs po qd x 5d, then 2 tabs po qd x 5d, then 1 tab po qd x 5d 35 tablet 0  . protein supplement shake (PREMIER PROTEIN) LIQD Take 2 oz by mouth 3 (three) times daily with meals.     No facility-administered medications prior to visit.     Allergies  Allergen Reactions  . Aricept [Donepezil Hcl]     Decreased appetite  . Atenolol     REACTION: INTOL Atenolol w/ bradycardia  . Codeine Phosphate      REACTION: nausea    ROS Review of Systems  Constitutional: Negative for fatigue and fever.  HENT: Negative for congestion and sore throat.   Respiratory: Negative for cough.   Cardiovascular: Negative for chest pain.  Gastrointestinal: Negative for abdominal pain and nausea.  Genitourinary: Negative for dysuria.  Musculoskeletal: Negative for back pain and joint swelling.  Skin: Negative for rash.  Neurological: Negative for weakness and headaches.  Hematological: Negative for adenopathy.    PE; Blood pressure 138/75, pulse 71, temperature (!) 97.2 F (36.2 C), temperature source Oral, resp. rate 16, height 5\' 7"  (1.702 m), weight 163 lb (73.9 kg), SpO2 98 %. Body mass index is 25.53 kg/m.  Gen: alert, well appearing, answers simple questions.  Is oriented to person and general situation. AFFECT: pleasantly demented. LFY:BOFB: no injection, icteris, swelling, or exudate.  Left eye with complete corneal opacification. Mouth: lips without lesion/swelling.  Oral mucosa pink and moist. Oropharynx without erythema, exudate, or swelling.  Neck - No masses or thyromegaly or limitation in range of motion CV: RRR, no m/r/g.   LUNGS: CTA bilat, nonlabored resps, good aeration in all lung fields. ABD: soft, NT, ND, BS normal.  No hepatospenomegaly or mass.  No bruits. EXT: no clubbing or cyanosis.  no edema.  Skin - no sores or suspicious lesions or rashes or color changes  Pertinent labs:  Lab  Results  Component Value Date   TSH 1.62 01/27/2013   Lab Results  Component Value Date   WBC 5.2 07/31/2017   HGB 12.8 (A) 07/31/2017   HCT 40 (A) 07/31/2017   MCV 85.2 11/17/2015   PLT 233 07/31/2017   Lab Results  Component Value Date   CREATININE 1.12 12/01/2015   BUN 23 (A) 07/31/2017   NA 133 (A) 07/31/2017   K 5.0 07/31/2017   CL 93 (L) 12/01/2015   CO2 32 12/01/2015   Lab Results  Component Value Date   ALT 10 11/17/2015   AST 13 11/17/2015   ALKPHOS 59 11/17/2015    BILITOT 0.3 11/17/2015   Lab Results  Component Value Date   CHOL 199 02/19/2014   Lab Results  Component Value Date   HDL 36.80 (L) 02/19/2014   Lab Results  Component Value Date   LDLCALC 124 (H) 02/19/2014   Lab Results  Component Value Date   TRIG 192.0 (H) 02/19/2014   Lab Results  Component Value Date   CHOLHDL 5 02/19/2014   Lab Results  Component Value Date   PSA 0.05 (L) 07/23/2014   PSA 0.03 (L) 01/27/2013   PSA 0.07 (L) 10/27/2009   Lab Results  Component Value Date   HGBA1C 10.0 07/31/2017    ASSESSMENT AND PLAN:   1) HTN: The current medical regimen is effective;  continue present plan and medications. Lytes/cr check today.  2) DM 2: hx of poor control. Goal a1c of <9% is reasonable for him. Check A1c today.  3) Dementia with behav disturbance: with high risk med use. Check CBC and CMET today.  4) Scabies, with one lesion superinfected with MRSA: he is s/p ivermectin tx as well as doxy x 2 wks. Skin looks great now.  He is on hydroxyzine for some residual itching.  An After Visit Summary was printed and given to the patient.  FOLLOW UP:  6 mo RCI  Signed:  Crissie Sickles, MD           01/12/2018

## 2018-01-09 ENCOUNTER — Encounter: Payer: Self-pay | Admitting: *Deleted

## 2018-01-09 ENCOUNTER — Other Ambulatory Visit: Payer: Self-pay | Admitting: *Deleted

## 2018-01-09 MED ORDER — PIOGLITAZONE HCL 15 MG PO TABS: 15.0000 mg | ORAL_TABLET | Freq: Every day | ORAL | 5 refills | Status: DC

## 2018-01-14 DIAGNOSIS — E785 Hyperlipidemia, unspecified: Secondary | ICD-10-CM | POA: Diagnosis not present

## 2018-01-14 DIAGNOSIS — I129 Hypertensive chronic kidney disease with stage 1 through stage 4 chronic kidney disease, or unspecified chronic kidney disease: Secondary | ICD-10-CM | POA: Diagnosis not present

## 2018-01-14 DIAGNOSIS — B86 Scabies: Secondary | ICD-10-CM | POA: Diagnosis not present

## 2018-01-14 DIAGNOSIS — M109 Gout, unspecified: Secondary | ICD-10-CM | POA: Diagnosis not present

## 2018-01-14 DIAGNOSIS — H409 Unspecified glaucoma: Secondary | ICD-10-CM | POA: Diagnosis not present

## 2018-01-14 DIAGNOSIS — N182 Chronic kidney disease, stage 2 (mild): Secondary | ICD-10-CM | POA: Diagnosis not present

## 2018-01-14 DIAGNOSIS — E1122 Type 2 diabetes mellitus with diabetic chronic kidney disease: Secondary | ICD-10-CM | POA: Diagnosis not present

## 2018-01-14 DIAGNOSIS — M1990 Unspecified osteoarthritis, unspecified site: Secondary | ICD-10-CM | POA: Diagnosis not present

## 2018-01-14 DIAGNOSIS — H3530 Unspecified macular degeneration: Secondary | ICD-10-CM | POA: Diagnosis not present

## 2018-01-20 ENCOUNTER — Telehealth: Payer: Self-pay | Admitting: Family Medicine

## 2018-01-20 NOTE — Telephone Encounter (Signed)
Copied from [place] [id number]. Topic: Quick Communication - See Telephone Encounter >> Jan 20, 2018 12:07 PM Bea Graff, NT wrote: CRM for notification. See Telephone encounter for: 01/20/18. Temeka with [facility] calling to get verbal orders for skilled nursing for 1 time a week for 8 weeks with 2 PRN visits. CB#: [phone]

## 2018-01-21 NOTE — Telephone Encounter (Signed)
Left detailed message on Temeka's phone stating verbal orders.

## 2018-01-21 NOTE — Telephone Encounter (Signed)
This is fine 

## 2018-02-06 ENCOUNTER — Other Ambulatory Visit: Payer: Self-pay | Admitting: Family Medicine

## 2018-02-06 NOTE — Telephone Encounter (Signed)
RF request for depakote LOV: 01/08/18 Next ov: None Last written: 09/06/17 #60 w/ 6RF  Please advise. Thanks.

## 2018-03-14 ENCOUNTER — Telehealth: Payer: Self-pay | Admitting: Family Medicine

## 2018-03-14 MED ORDER — FLUOXETINE HCL 40 MG PO CAPS: 40.0000 mg | ORAL_CAPSULE | Freq: Every day | ORAL | 1 refills | Status: AC

## 2018-03-14 MED ORDER — PIOGLITAZONE HCL 15 MG PO TABS: 15.0000 mg | ORAL_TABLET | Freq: Every day | ORAL | 1 refills | Status: AC

## 2018-03-14 MED ORDER — PERMETHRIN 5 % EX CREA: 1.0000 "application " | TOPICAL_CREAM | Freq: Once | CUTANEOUS | 1 refills | Status: AC

## 2018-03-14 NOTE — Telephone Encounter (Signed)
Detailed message left on voice mail. No one was available to take message in person.

## 2018-03-14 NOTE — Telephone Encounter (Signed)
Copied from [place] [id number]. 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

## 2018-03-14 NOTE — Telephone Encounter (Signed)
He is already on fluoxetine for sexual aggression.  I sent in an INCREASED dose (40 mg daily) to [facility] just now. I also sent in pioglitazone and scabies cream per request (sent to [facility]).

## 2018-03-26 ENCOUNTER — Ambulatory Visit: Payer: Self-pay

## 2018-03-26 NOTE — Telephone Encounter (Signed)
Outgoing call to Patients daughter who states that Patient has become sexually aggressive. Getting in male clients beds, wont let the staff bathe him.  Daughter is requesting an order for Ativan to calm him down.  Had an order for it before but it expired.  Asking for it to be renewed.  Daughter is requesting that Prescription be  faxed to  [phone] Atten: Shaunte.  For Ativan.   Daughters name is Tessie Fass [phone] Answer Assessment - Initial Assessment Questions 1. REASON FOR CALL: "What is your main concern right now?"     Phone call from Daughter who states that her Father is experiencing sexual aggression .  Climbing into other male clients beds.      4.       5. OTHER SYMPTOMS: "Do you have any other new symptoms     Daughter states that Patient wont let staff bathe him .Request that Ativan be Prescribed Right now the Previous Rx has expired.   6. INTERVENTIONS AND RESPONSE: "What have you done so far to try to make this better? What medications have you used?"     Had the dosage of Prozac increased. 7.  Protocols used: NO GUIDELINE AVAILABLE-A-AH

## 2018-03-26 NOTE — Telephone Encounter (Signed)
I will rx ativan, but I prefer eRx rather than faxing to somewhere. Pls clarify with his ALF whether or not they routinely get controlled substances faxed to them. -thx

## 2018-03-26 NOTE — Telephone Encounter (Signed)
[facility] and spoke with Horris Latino who tried to get a med tech on the phone but no one would answer. She took a message and will have them call back.

## 2018-03-26 NOTE — Telephone Encounter (Signed)
Please advise. Thanks.  

## 2018-03-27 ENCOUNTER — Other Ambulatory Visit: Payer: Self-pay | Admitting: Family Medicine

## 2018-03-27 MED ORDER — LORAZEPAM 2 MG/ML PO CONC: ORAL | 2 refills | Status: AC

## 2018-03-27 NOTE — Telephone Encounter (Signed)
[facility], they were unable to get a nurse or med tech on the phone. They took a message and will have them call me back.

## 2018-03-27 NOTE — Telephone Encounter (Signed)
OK. Lorazepam oral solution rx printed.

## 2018-03-27 NOTE — Telephone Encounter (Signed)
SW [facility]), she stated that we could send Rx electronically but they normally have providers fax the Rx's to them then they send the Rx to the pharmacy.  I tried to find the pharmacy they use for this pt in our system but was unable to find it.   [facility] Fax: [phone]

## 2018-03-27 NOTE — Telephone Encounter (Signed)
Rx faxed

## 2018-04-16 ENCOUNTER — Telehealth: Payer: Self-pay | Admitting: Family Medicine

## 2018-04-16 NOTE — Telephone Encounter (Signed)
Noted  

## 2018-04-16 NOTE — Telephone Encounter (Signed)
Copied from [place] [id number]. Topic: Quick Communication - See Telephone Encounter >> Apr 16, 2018 12:10 PM Cecelia Byars, NT wrote: CRM for notification. See Telephone encounter for: 04/16/18.  [name] called today to let the practice know the patient  fell ,he has a small laceration to the eye.EMS was called and they were told there was no need to take him to the ED by the EMS workers , please call  [phone], with any questions.

## 2018-04-17 ENCOUNTER — Telehealth: Payer: Self-pay | Admitting: Family Medicine

## 2018-04-17 DIAGNOSIS — I10 Essential (primary) hypertension: Secondary | ICD-10-CM

## 2018-04-17 DIAGNOSIS — Z79899 Other long term (current) drug therapy: Secondary | ICD-10-CM

## 2018-04-17 DIAGNOSIS — F0391 Unspecified dementia with behavioral disturbance: Secondary | ICD-10-CM

## 2018-04-17 DIAGNOSIS — E119 Type 2 diabetes mellitus without complications: Secondary | ICD-10-CM

## 2018-04-17 NOTE — Telephone Encounter (Signed)
Spoke with patients daughter Tessie Fass.  [name] reports patients health is declining. He is "much more frail" and fell yesterday. Patient is now incontinent and having difficulty with ADLs. Daughter requesting order for Hospice to evaluate/accept patient. Patient currently residing at [facility] unit.

## 2018-04-17 NOTE — Telephone Encounter (Signed)
Copied from [place] [id number]. Topic: Quick Communication - See Telephone Encounter >> Apr 17, 2018 10:25 AM Alfredia Ferguson R wrote: Patients daughter Tessie Fass is calling in wanting to know if a order can be sent for her dad to receive Hospice Care.  CB# [phone]

## 2018-04-17 NOTE — Telephone Encounter (Signed)
Kim, can you get this going?-thx

## 2018-04-17 NOTE — Telephone Encounter (Signed)
Please advise. Thanks.  

## 2018-04-23 DIAGNOSIS — Z0279 Encounter for issue of other medical certificate: Secondary | ICD-10-CM

## 2018-05-05 DIAGNOSIS — G301 Alzheimer's disease with late onset: Secondary | ICD-10-CM | POA: Diagnosis not present

## 2018-05-05 DIAGNOSIS — I10 Essential (primary) hypertension: Secondary | ICD-10-CM | POA: Diagnosis not present

## 2018-05-05 DIAGNOSIS — R627 Adult failure to thrive: Secondary | ICD-10-CM | POA: Diagnosis not present

## 2018-05-05 DIAGNOSIS — E1165 Type 2 diabetes mellitus with hyperglycemia: Secondary | ICD-10-CM | POA: Diagnosis not present

## 2019-03-09 ENCOUNTER — Emergency Department (HOSPITAL_COMMUNITY)
Admission: EM | Admit: 2019-03-09 | Discharge: 2019-03-09 | Disposition: A | Discharge status: Home / Self Care | Attending: Emergency Medicine | Admitting: Emergency Medicine

## 2019-03-09 ENCOUNTER — Encounter (HOSPITAL_COMMUNITY): Payer: Self-pay | Admitting: Radiology

## 2019-03-09 ENCOUNTER — Emergency Department (HOSPITAL_COMMUNITY)

## 2019-03-09 ENCOUNTER — Other Ambulatory Visit: Payer: Self-pay

## 2019-03-09 DIAGNOSIS — Y92129 Unspecified place in nursing home as the place of occurrence of the external cause: Secondary | ICD-10-CM | POA: Insufficient documentation

## 2019-03-09 DIAGNOSIS — Y998 Other external cause status: Secondary | ICD-10-CM | POA: Insufficient documentation

## 2019-03-09 DIAGNOSIS — E119 Type 2 diabetes mellitus without complications: Secondary | ICD-10-CM | POA: Diagnosis not present

## 2019-03-09 DIAGNOSIS — W19XXXA Unspecified fall, initial encounter: Secondary | ICD-10-CM | POA: Diagnosis not present

## 2019-03-09 DIAGNOSIS — Y939 Activity, unspecified: Secondary | ICD-10-CM | POA: Diagnosis not present

## 2019-03-09 DIAGNOSIS — S0990XA Unspecified injury of head, initial encounter: Secondary | ICD-10-CM

## 2019-03-09 DIAGNOSIS — R296 Repeated falls: Secondary | ICD-10-CM

## 2019-03-09 DIAGNOSIS — F0390 Unspecified dementia without behavioral disturbance: Secondary | ICD-10-CM | POA: Diagnosis not present

## 2019-03-09 DIAGNOSIS — S51811A Laceration without foreign body of right forearm, initial encounter: Secondary | ICD-10-CM | POA: Diagnosis not present

## 2019-03-09 DIAGNOSIS — S0181XA Laceration without foreign body of other part of head, initial encounter: Secondary | ICD-10-CM | POA: Insufficient documentation

## 2019-03-09 DIAGNOSIS — Z7984 Long term (current) use of oral hypoglycemic drugs: Secondary | ICD-10-CM | POA: Diagnosis not present

## 2019-03-09 DIAGNOSIS — Z87891 Personal history of nicotine dependence: Secondary | ICD-10-CM | POA: Diagnosis not present

## 2019-03-09 DIAGNOSIS — Z85828 Personal history of other malignant neoplasm of skin: Secondary | ICD-10-CM | POA: Diagnosis not present

## 2019-03-09 DIAGNOSIS — Z79899 Other long term (current) drug therapy: Secondary | ICD-10-CM | POA: Diagnosis not present

## 2019-03-09 DIAGNOSIS — I10 Essential (primary) hypertension: Secondary | ICD-10-CM | POA: Insufficient documentation

## 2019-03-09 LAB — CBC WITH DIFFERENTIAL/PLATELET
Abs Immature Granulocytes: 0.01 10*3/uL (ref 0.00–0.07)
Basophils Absolute: 0 10*3/uL (ref 0.0–0.1)
Basophils Relative: 1 %
Eosinophils Absolute: 0 10*3/uL (ref 0.0–0.5)
Eosinophils Relative: 0 %
HCT: 34.8 % — ABNORMAL LOW (ref 39.0–52.0)
Hemoglobin: 10.9 g/dL — ABNORMAL LOW (ref 13.0–17.0)
Immature Granulocytes: 0 %
Lymphocytes Relative: 25 %
Lymphs Abs: 1 10*3/uL (ref 0.7–4.0)
MCH: 30.1 pg (ref 26.0–34.0)
MCHC: 31.3 g/dL (ref 30.0–36.0)
MCV: 96.1 fL (ref 80.0–100.0)
Monocytes Absolute: 0.8 10*3/uL (ref 0.1–1.0)
Monocytes Relative: 21 %
Neutro Abs: 2.1 10*3/uL (ref 1.7–7.7)
Neutrophils Relative %: 53 %
Platelets: 243 10*3/uL (ref 150–400)
RBC: 3.62 MIL/uL — ABNORMAL LOW (ref 4.22–5.81)
RDW: 14.6 % (ref 11.5–15.5)
WBC: 3.9 10*3/uL — ABNORMAL LOW (ref 4.0–10.5)
nRBC: 0 % (ref 0.0–0.2)

## 2019-03-09 LAB — URINALYSIS, ROUTINE W REFLEX MICROSCOPIC
Bacteria, UA: NONE SEEN
Bilirubin Urine: NEGATIVE
Glucose, UA: NEGATIVE mg/dL
Ketones, ur: NEGATIVE mg/dL
Leukocytes,Ua: NEGATIVE
Nitrite: NEGATIVE
Protein, ur: NEGATIVE mg/dL
Specific Gravity, Urine: 1.006 (ref 1.005–1.030)
pH: 5 (ref 5.0–8.0)

## 2019-03-09 LAB — BASIC METABOLIC PANEL
Anion gap: 11 (ref 5–15)
BUN: 32 mg/dL — ABNORMAL HIGH (ref 8–23)
CO2: 27 mmol/L (ref 22–32)
Calcium: 8.8 mg/dL — ABNORMAL LOW (ref 8.9–10.3)
Chloride: 99 mmol/L (ref 98–111)
Creatinine, Ser: 1.38 mg/dL — ABNORMAL HIGH (ref 0.61–1.24)
GFR calc Af Amer: 54 mL/min — ABNORMAL LOW (ref >60)
GFR calc non Af Amer: 46 mL/min — ABNORMAL LOW (ref >60)
Glucose, Bld: 111 mg/dL — ABNORMAL HIGH (ref 70–99)
Potassium: 4.4 mmol/L (ref 3.5–5.1)
Sodium: 137 mmol/L (ref 135–145)

## 2019-03-09 MED ORDER — LIDOCAINE-EPINEPHRINE-TETRACAINE (LET) SOLUTION
3.0000 mL | Freq: Once | NASAL | Status: AC
  Administered 2019-03-09: 3 mL via TOPICAL
  Filled 2019-03-09: qty 3

## 2019-03-09 MED ORDER — SODIUM CHLORIDE 0.9 % IV BOLUS
500.0000 mL | Freq: Once | INTRAVENOUS | Status: AC
  Administered 2019-03-09: 16:00:00 500 mL via INTRAVENOUS

## 2019-03-09 MED ORDER — LIDOCAINE-EPINEPHRINE (PF) 2 %-1:200000 IJ SOLN
20.0000 mL | Freq: Once | INTRAMUSCULAR | Status: AC
  Administered 2019-03-09: 20 mL via INTRADERMAL
  Filled 2019-03-09: qty 20

## 2019-03-09 NOTE — ED Triage Notes (Addendum)
Pt BIB EMS from [facility]. Facility reports unwitnessed fall. Pt has small laceration to forehead. Facility reports patient hitting head. Hx of dementia. Baseline is AXO x1. Pt denies pain.

## 2019-03-09 NOTE — ED Notes (Signed)
Gave report to Santiago Glad, Librarian, academic at [facility]

## 2019-03-09 NOTE — ED Provider Notes (Signed)
[facility] DEPT Provider Note   CSN: [id number] Arrival date & time: 03/09/19  1259     History   Chief Complaint Chief Complaint  Patient presents with   Fall   Head Laceration   Level 5 caveat due to dementia HPI Randy Cohen is a 85 y.o. male with history of dementia, hypertension, macular degeneration of left eye, type 2 diabetes, CKD presents via EMS from [facility] for evaluation after unwitnessed fall.  Patient has a small laceration and ecchymosis to the forehead as well as a large linear laceration to the ulnar aspect of the right forearm.  He denies any pain.  He is oriented to person which is his baseline.  He is DNR, currently in hospice/comfort care.    The history is provided by the patient.    Past Medical History:  Diagnosis Date   Allergic rhinitis    Anxiety    Benign prostatic hypertrophy    Carcinoma of prostate ([place])    Chronic renal insufficiency, stage II (mild) 2017   GFR 60s   Dementia arising in the senium and presenium ([place]) 2015   Diverticulosis of colon    DJD (degenerative joint disease)    Glaucoma of left eye    Hiatal hernia    History of gout    History of peptic ulcer    Hyperlipidemia    Hypertension    Hyphema of left eye 06/2012   Macular degeneration of left eye    MRSA (methicillin resistant staph aureus) culture positive    Skin bx of scabies rash: impetiginization (sensitive to doxy)   Peripheral edema 11/2015   Suspect chronic venous insufficiency   Scabies 11/2017   skin (shave) bx + for remnant of mite/scabies at derm 12/05/17   Type 2 diabetes with complication [facility])     Patient Active Problem List   Diagnosis Date Noted   Peripheral edema 12/02/2015   Limp 08/31/2015   Right leg pain 08/31/2015   Penile discharge 07/20/2015   High risk medication use 03/18/2015   Diabetes mellitus without complication ([place]) [id number]    Encounter for examination for admission to assisted living facility 02/21/2014   Dementia arising in the senium and presenium [facility])    Memory loss or impairment 11/20/2013   Leg weakness, bilateral 11/19/2013   HTN (hypertension), benign 11/04/2013   Memory loss 11/04/2013   Skin exam, screening for cancer 11/04/2013   UNSPECIFIED VISUAL LOSS 10/22/2008   ALLERGIC RHINITIS 10/22/2008   BENIGN PROSTATIC HYPERTROPHY, HX OF 10/25/2007   CARCINOMA, PROSTATE 10/23/2007   HYPERCHOLESTEROLEMIA 10/23/2007   GOUT 10/23/2007   ANXIETY 10/23/2007   HIATAL HERNIA 10/23/2007   DIVERTICULOSIS OF COLON 10/23/2007   DEGENERATIVE JOINT DISEASE 10/23/2007   HYPERTENSION 04/08/2007    Past Surgical History:  Procedure Laterality Date   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 Amalia Hailey)   left cataract surgery  10/2008   Dr. Charise Killian   left shoulder surgery     shoulder replacement   NASAL SINUS SURGERY  remote past   for sinus polyps   right knee surgery     Laceration from a saw   SKIN CANCER EXCISION  2011   Basal cell   TONSILLECTOMY AND ADENOIDECTOMY  Home Medications    Prior to Admission medications   Medication Sig Start Date End Date Taking? Authorizing Provider  acetaminophen (TYLENOL) 500 MG tablet Take 1,000 mg by mouth 2 (two) times daily.    Yes [provider]  allopurinol (ZYLOPRIM) 100 MG tablet Take 100 mg by mouth daily.   Yes [provider]  alum & mag hydroxide-simeth (MAALOX/MYLANTA) 200-200-20 MG/5ML suspension Take 30 mLs by mouth every 6 (six) hours as needed for indigestion or heartburn.   Yes [provider]  camphor-menthol Timoteo Ace) lotion Apply 1 application topically daily.   Yes [provider]  diltiazem (CARDIZEM CD) 120  MG 24 hr capsule Take 1 capsule (120 mg total) by mouth daily. 02/22/14  Yes McGowen, Adrian Blackwater, MD  divalproex (DEPAKOTE) 250 MG DR tablet TAKE 2 TABS (500MG ) BY MOUTH IN THE EVENING Patient taking differently: Take 250 mg by mouth daily.  02/06/18  Yes McGowen, Adrian Blackwater, MD  FLUoxetine (PROZAC) 40 MG capsule Take 1 capsule (40 mg total) by mouth daily. 03/14/18  Yes McGowen, Adrian Blackwater, MD  guaiFENesin (ROBITUSSIN) 100 MG/5ML liquid Take 10 mLs by mouth 4 (four) times daily as needed for cough.   Yes [provider]  loperamide (IMODIUM) 2 MG capsule Take 2 mg by mouth daily as needed for diarrhea or loose stools.    Yes [provider]  LORazepam (ATIVAN) 0.5 MG tablet Take 0.5 mg by mouth every 8 (eight) hours as needed for anxiety (agitation).   Yes [provider]  losartan (COZAAR) 25 MG tablet Take 25 mg by mouth daily.   Yes [provider]  magnesium hydroxide (MILK OF MAGNESIA) 400 MG/5ML suspension Take 30 mLs by mouth daily as needed for mild constipation.   Yes [provider]  metFORMIN (GLUCOPHAGE) 1000 MG tablet TAKE 1 TABLET BY MOUTH 2 TIMES DAILY WITH A MEAL Patient taking differently: Take 1,000 mg by mouth 2 (two) times daily with a meal.  11/25/17  Yes McGowen, Adrian Blackwater, MD  miconazole (BAZA ANTIFUNGAL) 2 % cream Apply 1 application topically 2 (two) times daily as needed. Apply to buttocks & testicles.   Yes [provider]  Nutritional Supplements (NUTRITIONAL SHAKE) LIQD Take 1 each by mouth 3 (three) times daily.   Yes [provider]  ondansetron (ZOFRAN) 8 MG tablet Take 8 mg by mouth every 8 (eight) hours as needed for nausea or vomiting.   Yes [provider]  potassium chloride SA (KLOR-CON) 20 MEQ tablet Take 20 mEq by mouth daily.   Yes [provider]  risperiDONE (RISPERDAL) 1 MG tablet Take 1 mg by mouth daily.   Yes [provider]  torsemide (DEMADEX) 20 MG tablet Take 40 mg by  mouth daily.   Yes [provider]  traMADol (ULTRAM) 50 MG tablet Take 50 mg by mouth 2 (two) times daily.   Yes [provider]  trolamine salicylate (ASPERCREME) 10 % cream Apply 1 application topically 2 (two) times daily as needed for muscle pain (Both Knees).   Yes [provider]  artificial tears (LACRILUBE) OINT ophthalmic ointment Place into both eyes 3 (three) times daily. Patient not taking: Reported on 12/28/2016 05/22/14   Dorie Rank, MD  fexofenadine (ALLEGRA ODT) 30 MG disintegrating tablet 1 tab po bid x 15 days Patient not taking: Reported on 01/08/2018 11/13/17   Tammi Sou, MD  furosemide (LASIX) 40 MG tablet Take 1 tablet (40 mg total) by mouth daily. Patient  not taking: Reported on 03/09/2019 02/17/16   Tammi Sou, MD  LORazepam (ATIVAN) 2 MG/ML concentrated solution 1/2 ml (1 mg) po bid prn aggressive sexual behavior Patient not taking: Reported on 03/09/2019 03/27/18   Tammi Sou, MD  pioglitazone (ACTOS) 15 MG tablet Take 1 tablet (15 mg total) by mouth daily. Patient not taking: Reported on 03/09/2019 03/14/18   Tammi Sou, MD  predniSONE (DELTASONE) 5 MG tablet 4 tabs po qd x 5d, then 2 tabs po qd x 5d, then 1 tab po qd x 5d 09/05/17   McGowen, Adrian Blackwater, MD    Family History Family History  Problem Relation Age of Onset   Colon cancer Brother    Macular degeneration Brother    Heart Problems Brother        pacemaker   Breast cancer Sister     Social History Social History   Tobacco Use   Smoking status: Former Smoker    Packs/day: 3.00    Years: 25.00    Pack years: 75.00    Types: Cigarettes    Quit date: 05/07/1958    Years since quitting: 60.8   Smokeless tobacco: Never Used  Substance Use Topics   Alcohol use: No   Drug use: No     Allergies   Aricept [donepezil hcl], Atenolol, and Codeine phosphate   Review of Systems Review of Systems  Unable to perform ROS: Dementia     Physical  Exam Updated Vital Signs BP 110/72    Pulse 62    Temp 97.9 F (36.6 C) (Oral)    Resp 17    SpO2 95%   Physical Exam Vitals signs and nursing note reviewed.  Constitutional:      General: He is not in acute distress.    Appearance: He is well-developed.  HENT:     Head: Normocephalic and atraumatic.     Comments: 1 cm superficial laceration to the middle of the forehead with some surrounding swelling and ecchymosis.  No hemotympanum, no trismus or jaw malocclusion.  No battle signs, raccoon eyes, or rhinorrhea. Eyes:     General:        Right eye: No discharge.        Left eye: No discharge.     Conjunctiva/sclera: Conjunctivae normal.     Comments: Left eye with hazy cornea, per daughter this is baseline.  Right eye with 3 mm pupil reactive to light.  Neck:     Musculoskeletal: Neck supple.     Vascular: No JVD.     Trachea: No tracheal deviation.     Comments: No midline cervical spine tenderness, no deformity, crepitus, or step-off. Cardiovascular:     Rate and Rhythm: Normal rate and regular rhythm.     Pulses: Normal pulses.     Heart sounds: Normal heart sounds.  Pulmonary:     Effort: Pulmonary effort is normal.     Breath sounds: Normal breath sounds.     Comments: Atraumatic examination with no tenderness, crepitus, deformity, or flail segment Chest:     Chest wall: No tenderness.  Abdominal:     General: Bowel sounds are normal. There is no distension.     Palpations: Abdomen is soft.     Tenderness: There is no abdominal tenderness. There is no guarding or rebound.     Comments: No tenderness or ecchymosis  Musculoskeletal:        General: No tenderness.     Comments: 11 cm linear  laceration to the ulnar aspect of the right forearm.  Underlying fascia and muscle visible.  There is also a 6 centimeter V-shaped skin avulsion inferior to this.  There is also a 3 cm more superficial laceration inferior to the larger laceration.  Skin:    General: Skin is warm and  dry.     Findings: No erythema.  Neurological:     Mental Status: He is alert.     Comments: Oriented to person only.  Answer some yes/no questions.  Follows some commands but not others.  Upper extremities are tremulous at baseline.  Moves extremities with little difficulty. Good and equal grip strength bilaterally  Psychiatric:        Behavior: Behavior normal.        ED Treatments / Results  Labs (all labs ordered are listed, but only abnormal results are displayed) Labs Reviewed  URINALYSIS, ROUTINE W REFLEX MICROSCOPIC - Abnormal; Notable for the following components:      Result Value   Color, Urine STRAW (*)    Hgb urine dipstick SMALL (*)    All other components within normal limits  BASIC METABOLIC PANEL - Abnormal; Notable for the following components:   Glucose, Bld 111 (*)    BUN 32 (*)    Creatinine, Ser 1.38 (*)    Calcium 8.8 (*)    GFR calc non Af Amer 46 (*)    GFR calc Af Amer 54 (*)    All other components within normal limits  CBC WITH DIFFERENTIAL/PLATELET - Abnormal; Notable for the following components:   WBC 3.9 (*)    RBC 3.62 (*)    Hemoglobin 10.9 (*)    HCT 34.8 (*)    All other components within normal limits    EKG EKG Interpretation  Date/Time:  Monday March 09 2019 14:13:21 EST Ventricular Rate:  173 PR Interval:    QRS Duration: 114 QT Interval:  351 QTC Calculation: 446 R Axis:   15 Text Interpretation: Supraventricular tachycardia Ventricular tachycardia, unsustained Incomplete left bundle branch block Artifact in lead(s) I II III aVR aVL aVF V1 V2 V3 V5 Confirmed by Blanchie Dessert [phone]) on 03/09/2019 2:40:28 PM   Radiology Dg Forearm Right  Result Date: 03/09/2019 CLINICAL DATA:  Laceration EXAM: RIGHT FOREARM - 2 VIEW COMPARISON:  None. FINDINGS: No radiopaque foreign body. No acute fracture. Vascular calcification is noted. IMPRESSION: No acute osseous abnormality or radiopaque foreign body. Electronically Signed   By:  Macy Mis M.D.   On: 03/09/2019 14:42   Ct Head Wo Contrast  Result Date: 03/09/2019 CLINICAL DATA:  Unwitnessed fall with forehead laceration. Head injury. EXAM: CT HEAD WITHOUT CONTRAST CT MAXILLOFACIAL WITHOUT CONTRAST CT CERVICAL SPINE WITHOUT CONTRAST TECHNIQUE: Multidetector CT imaging of the head, cervical spine, and maxillofacial structures were performed using the standard protocol without intravenous contrast. Multiplanar CT image reconstructions of the cervical spine and maxillofacial structures were also generated. COMPARISON:  MRI head 11/26/2013. FINDINGS: CT HEAD FINDINGS Brain: There is no evidence of acute intracranial hemorrhage, mass lesion, brain edema or extra-axial fluid collection. There is stable atrophy with prominence of the ventricles and subarachnoid spaces, the latter asymmetric to the right and stable. Mild chronic small vessel ischemic changes in the periventricular white matter are stable. There is no CT evidence of acute cortical infarction. Vascular: Intracranial vascular calcifications. No hyperdense vessel identified. Skull: Negative for fracture or focal lesion. Other: None. CT MAXILLOFACIAL FINDINGS Osseous: This portion of the study is mildly motion degraded.  There is no evidence of acute fracture or dislocation. There is chronic mucoperiosteal thickening throughout the paranasal sinuses, especially in the right maxillary sinus. The temporomandibular joints are intact. Orbits: Irregular left ocular calcifications appear chronic, similar to previous MRI. The right globe is intact. No evidence of orbital hematoma. The optic nerves and extraocular muscles appear intact. Sinuses: Diffuse mucoperiosteal thickening throughout the paranasal sinuses. The right maxillary sinus is chronically opacified. There is mucosal thickening in the frontal and ethmoid sinuses bilaterally. The mastoid air cells and middle ears are clear. Soft tissues: No facial soft tissue hematoma or  foreign body. The mastoid air cells and middle ears are clear. CT CERVICAL SPINE FINDINGS Alignment: Normal. Skull base and vertebrae: No evidence of acute cervical spine fracture or traumatic subluxation. Probable incomplete segmentation or ankylosis of the C7-T1 vertebral bodies and facet joints. Soft tissues and spinal canal: No prevertebral fluid or swelling. No visible canal hematoma. Disc levels: There is mild multilevel spondylosis with disc space narrowing, uncinate spurring and facet hypertrophy. Resulting mild foraminal narrowing, greatest on the left at C3-4 and C4-5. No large disc herniation identified. Upper chest: Bilateral carotid atherosclerosis. Other: None. IMPRESSION: 1. No acute intracranial or calvarial findings. Stable atrophy and chronic small vessel ischemic changes. 2. No evidence of acute facial fracture or orbital hematoma. Stable findings of chronic sinusitis and chronic left ocular findings, presumed posttraumatic or postsurgical. 3. No evidence of acute cervical spine fracture, traumatic subluxation or static signs of instability. Mild spondylosis. Electronically Signed   By: Richardean Sale M.D.   On: 03/09/2019 14:48   Ct Cervical Spine Wo Contrast  Result Date: 03/09/2019 CLINICAL DATA:  Unwitnessed fall with forehead laceration. Head injury. EXAM: CT HEAD WITHOUT CONTRAST CT MAXILLOFACIAL WITHOUT CONTRAST CT CERVICAL SPINE WITHOUT CONTRAST TECHNIQUE: Multidetector CT imaging of the head, cervical spine, and maxillofacial structures were performed using the standard protocol without intravenous contrast. Multiplanar CT image reconstructions of the cervical spine and maxillofacial structures were also generated. COMPARISON:  MRI head 11/26/2013. FINDINGS: CT HEAD FINDINGS Brain: There is no evidence of acute intracranial hemorrhage, mass lesion, brain edema or extra-axial fluid collection. There is stable atrophy with prominence of the ventricles and subarachnoid spaces, the  latter asymmetric to the right and stable. Mild chronic small vessel ischemic changes in the periventricular white matter are stable. There is no CT evidence of acute cortical infarction. Vascular: Intracranial vascular calcifications. No hyperdense vessel identified. Skull: Negative for fracture or focal lesion. Other: None. CT MAXILLOFACIAL FINDINGS Osseous: This portion of the study is mildly motion degraded. There is no evidence of acute fracture or dislocation. There is chronic mucoperiosteal thickening throughout the paranasal sinuses, especially in the right maxillary sinus. The temporomandibular joints are intact. Orbits: Irregular left ocular calcifications appear chronic, similar to previous MRI. The right globe is intact. No evidence of orbital hematoma. The optic nerves and extraocular muscles appear intact. Sinuses: Diffuse mucoperiosteal thickening throughout the paranasal sinuses. The right maxillary sinus is chronically opacified. There is mucosal thickening in the frontal and ethmoid sinuses bilaterally. The mastoid air cells and middle ears are clear. Soft tissues: No facial soft tissue hematoma or foreign body. The mastoid air cells and middle ears are clear. CT CERVICAL SPINE FINDINGS Alignment: Normal. Skull base and vertebrae: No evidence of acute cervical spine fracture or traumatic subluxation. Probable incomplete segmentation or ankylosis of the C7-T1 vertebral bodies and facet joints. Soft tissues and spinal canal: No prevertebral fluid or swelling. No visible canal hematoma. Disc  levels: There is mild multilevel spondylosis with disc space narrowing, uncinate spurring and facet hypertrophy. Resulting mild foraminal narrowing, greatest on the left at C3-4 and C4-5. No large disc herniation identified. Upper chest: Bilateral carotid atherosclerosis. Other: None. IMPRESSION: 1. No acute intracranial or calvarial findings. Stable atrophy and chronic small vessel ischemic changes. 2. No  evidence of acute facial fracture or orbital hematoma. Stable findings of chronic sinusitis and chronic left ocular findings, presumed posttraumatic or postsurgical. 3. No evidence of acute cervical spine fracture, traumatic subluxation or static signs of instability. Mild spondylosis. Electronically Signed   By: Richardean Sale M.D.   On: 03/09/2019 14:48   Ct Maxillofacial Wo Cm  Result Date: 03/09/2019 CLINICAL DATA:  Unwitnessed fall with forehead laceration. Head injury. EXAM: CT HEAD WITHOUT CONTRAST CT MAXILLOFACIAL WITHOUT CONTRAST CT CERVICAL SPINE WITHOUT CONTRAST TECHNIQUE: Multidetector CT imaging of the head, cervical spine, and maxillofacial structures were performed using the standard protocol without intravenous contrast. Multiplanar CT image reconstructions of the cervical spine and maxillofacial structures were also generated. COMPARISON:  MRI head 11/26/2013. FINDINGS: CT HEAD FINDINGS Brain: There is no evidence of acute intracranial hemorrhage, mass lesion, brain edema or extra-axial fluid collection. There is stable atrophy with prominence of the ventricles and subarachnoid spaces, the latter asymmetric to the right and stable. Mild chronic small vessel ischemic changes in the periventricular white matter are stable. There is no CT evidence of acute cortical infarction. Vascular: Intracranial vascular calcifications. No hyperdense vessel identified. Skull: Negative for fracture or focal lesion. Other: None. CT MAXILLOFACIAL FINDINGS Osseous: This portion of the study is mildly motion degraded. There is no evidence of acute fracture or dislocation. There is chronic mucoperiosteal thickening throughout the paranasal sinuses, especially in the right maxillary sinus. The temporomandibular joints are intact. Orbits: Irregular left ocular calcifications appear chronic, similar to previous MRI. The right globe is intact. No evidence of orbital hematoma. The optic nerves and extraocular muscles  appear intact. Sinuses: Diffuse mucoperiosteal thickening throughout the paranasal sinuses. The right maxillary sinus is chronically opacified. There is mucosal thickening in the frontal and ethmoid sinuses bilaterally. The mastoid air cells and middle ears are clear. Soft tissues: No facial soft tissue hematoma or foreign body. The mastoid air cells and middle ears are clear. CT CERVICAL SPINE FINDINGS Alignment: Normal. Skull base and vertebrae: No evidence of acute cervical spine fracture or traumatic subluxation. Probable incomplete segmentation or ankylosis of the C7-T1 vertebral bodies and facet joints. Soft tissues and spinal canal: No prevertebral fluid or swelling. No visible canal hematoma. Disc levels: There is mild multilevel spondylosis with disc space narrowing, uncinate spurring and facet hypertrophy. Resulting mild foraminal narrowing, greatest on the left at C3-4 and C4-5. No large disc herniation identified. Upper chest: Bilateral carotid atherosclerosis. Other: None. IMPRESSION: 1. No acute intracranial or calvarial findings. Stable atrophy and chronic small vessel ischemic changes. 2. No evidence of acute facial fracture or orbital hematoma. Stable findings of chronic sinusitis and chronic left ocular findings, presumed posttraumatic or postsurgical. 3. No evidence of acute cervical spine fracture, traumatic subluxation or static signs of instability. Mild spondylosis. Electronically Signed   By: Richardean Sale M.D.   On: 03/09/2019 14:48    Procedures .Marland KitchenLaceration Repair  Date/Time: 03/09/2019 8:13 PM Performed by: Renita Papa, PA-C Authorized by: Renita Papa, PA-C   Consent:    Consent obtained:  Verbal   Consent given by:  Healthcare agent   Risks discussed:  Infection, need for additional repair,  pain, poor cosmetic result and poor wound healing   Alternatives discussed:  No treatment and delayed treatment Universal protocol:    Procedure explained and questions answered  to patient or proxy's satisfaction: yes     Relevant documents present and verified: yes     Test results available and properly labeled: yes     Imaging studies available: yes     Required blood products, implants, devices, and special equipment available: yes     Site/side marked: yes     Immediately prior to procedure, a time out was called: yes     Patient identity confirmed:  Arm band and verbally with patient Anesthesia (see MAR for exact dosages):    Anesthesia method:  Local infiltration and topical application   Topical anesthetic:  LET   Local anesthetic:  Lidocaine 2% WITH epi Laceration details:    Location:  Shoulder/arm   Shoulder/arm location:  R lower arm   Length (cm):  11   Depth (mm):  4 Repair type:    Repair type:  Intermediate Pre-procedure details:    Preparation:  Imaging obtained to evaluate for foreign bodies and patient was prepped and draped in usual sterile fashion Exploration:    Hemostasis achieved with:  Direct pressure   Wound extent: areolar tissue violated and fascia violated     Wound extent: no tendon damage noted and no underlying fracture noted     Contaminated: no   Skin repair:    Repair method:  Sutures and Steri-Strips   Suture size:  5-0   Wound skin closure material used: vicryl.   Suture technique:  Simple interrupted   Number of sutures:  10   Number of Steri-Strips:  15 Approximation:    Approximation:  Close Post-procedure details:    Dressing:  Sterile dressing   Patient tolerance of procedure:  Tolerated well, no immediate complications .Marland KitchenLaceration Repair  Date/Time: 03/09/2019 8:15 PM Performed by: Renita Papa, PA-C Authorized by: Renita Papa, PA-C   Consent:    Consent obtained:  Verbal   Consent given by:  Healthcare agent   Risks discussed:  Infection, need for additional repair, pain, poor cosmetic result and poor wound healing   Alternatives discussed:  No treatment and delayed treatment Universal protocol:      Procedure explained and questions answered to patient or proxy's satisfaction: yes     Relevant documents present and verified: yes     Test results available and properly labeled: yes     Imaging studies available: yes     Required blood products, implants, devices, and special equipment available: yes     Site/side marked: yes     Immediately prior to procedure, a time out was called: yes     Patient identity confirmed:  Arm band and verbally with patient Anesthesia (see MAR for exact dosages):    Anesthesia method:  None Laceration details:    Location:  Shoulder/arm   Shoulder/arm location:  R lower arm   Length (cm):  6   Depth (mm):  2 Repair type:    Repair type:  Simple Pre-procedure details:    Preparation:  Patient was prepped and draped in usual sterile fashion and imaging obtained to evaluate for foreign bodies Exploration:    Hemostasis achieved with:  Direct pressure   Wound exploration: wound explored through full range of motion and entire depth of wound probed and visualized     Wound extent: no fascia violation noted and no underlying  fracture noted   Treatment:    Area cleansed with:  Betadine and saline   Amount of cleaning:  Extensive   Irrigation method:  Pressure wash   Visualized foreign bodies/material removed: no   Skin repair:    Repair method:  Steri-Strips   Number of Steri-Strips:  3 Approximation:    Approximation:  Close Post-procedure details:    Dressing:  Adhesive bandage   Patient tolerance of procedure:  Tolerated well, no immediate complications .Marland KitchenLaceration Repair  Date/Time: 03/09/2019 8:16 PM Performed by: Renita Papa, PA-C Authorized by: Renita Papa, PA-C   Consent:    Consent obtained:  Verbal   Consent given by:  Healthcare agent   Risks discussed:  Infection, need for additional repair, pain, poor cosmetic result and poor wound healing   Alternatives discussed:  No treatment and delayed treatment Universal protocol:     Procedure explained and questions answered to patient or proxy's satisfaction: yes     Relevant documents present and verified: yes     Test results available and properly labeled: yes     Imaging studies available: yes     Required blood products, implants, devices, and special equipment available: yes     Site/side marked: yes     Immediately prior to procedure, a time out was called: yes     Patient identity confirmed:  Verbally with patient and arm band Anesthesia (see MAR for exact dosages):    Anesthesia method:  None Laceration details:    Location:  Shoulder/arm   Shoulder/arm location:  R lower arm   Length (cm):  3   Depth (mm):  1 Repair type:    Repair type:  Simple Exploration:    Hemostasis achieved with:  Direct pressure   Wound exploration: wound explored through full range of motion and entire depth of wound probed and visualized     Wound extent: no underlying fracture noted     Contaminated: no   Treatment:    Amount of cleaning:  Extensive   Irrigation solution:  Sterile water Skin repair:    Repair method:  Steri-Strips   Number of Steri-Strips:  3 Approximation:    Approximation:  Close Post-procedure details:    Dressing:  Sterile dressing   Patient tolerance of procedure:  Tolerated well, no immediate complications   (including critical care time)  Medications Ordered in ED Medications  lidocaine-EPINEPHrine (XYLOCAINE W/EPI) 2 %-1:200000 (PF) injection 20 mL (20 mLs Intradermal Given by Other 03/09/19 2016)  lidocaine-EPINEPHrine-tetracaine (LET) solution (3 mLs Topical Given 03/09/19 1501)  sodium chloride 0.9 % bolus 500 mL (0 mLs Intravenous Stopped 03/09/19 1709)     Initial Impression / Assessment and Plan / ED Course  I have reviewed the triage vital signs and the nursing notes.  Pertinent labs & imaging results that were available during my care of the patient were reviewed by me and considered in my medical decision making (see chart for  details).       Patient presents today for evaluation after unwitnessed fall.  He was sent from his skilled nursing facility.  He has a history of dementia is oriented to person at baseline.  He is afebrile, vital signs are stable.  He is nontoxic in appearance.  He answers some questions appropriately, follows some commands.  I spoke with his daughter who is his power of attorney who states that she was told that he was found down in his room, sustained a wound to his right  forearm and forehead but they are unsure how he fell.  He is unable to provide any significant history.  Lab work today reviewed by me shows no leukocytosis, mild anemia, no metabolic derangements.  His BUN and creatinine are little bit elevated compared to lab work from a year ago, however it is likely in the setting of some dehydration.  Will give fluid bolus.  He is in hospice care, currently only receiving comfort care.  UA does not suggest UTI or nephrolithiasis.  EKG shows artifact, in the room on telemetry appears to be in normal sinus rhythm.  Imaging today shows no evidence of skull fracture, intracranial hemorrhage, cervical spine injury, facial fracture, or fracture of the forearm or retained foreign body.  His tetanus is up-to-date.  Daughter was agreeable to laceration repair which we discussed over the phone.  His wounds were extensively cleaned, base of the wound visualized in a bloodless field.  His large laceration was repaired with absorbable suture and Steri-Strips.  He had 1 more superficial laceration and a skin avulsion to the right forearm which were repaired with Steri-Strips.  His forehead wound is very superficial and will not require any additional repair.  He has been resting comfortably while in the ED in no distress.  Stable for discharge back to his facility.  Patient seen and evaluated by Dr. Maryan Rued who agrees with assessment and plan at this time.  Final Clinical Impressions(s) / ED Diagnoses   Final  diagnoses:  Unwitnessed fall  Injury of head, initial encounter  Laceration of forehead, initial encounter  Laceration of right forearm, initial encounter    ED Discharge Orders    None       Debroah Baller 03/09/19 2109    Blanchie Dessert, MD 03/09/19 2213

## 2019-03-09 NOTE — ED Notes (Signed)
[facility] called  

## 2019-03-09 NOTE — ED Notes (Signed)
R Forearm dressing removed with Rodell Perna, PA. Pt has approximately 15 cm long open laceration to R forearm. Wound bleeding profusely. New dry dressing applied with Winfield Cunas, PA.

## 2019-03-09 NOTE — ED Notes (Signed)
Pt arrived with R forearm wrapped in gauze, dry blood on dressing.

## 2019-03-09 NOTE — Discharge Instructions (Signed)
Lacerations repaired with absorbable sutures and Steri-Strips.  These will fall off on their own.  Keep the wounds clean and dry.  Follow-up with primary care provider for reevaluation of symptoms.  Return to the emergency department if any concerning signs or symptoms develop such as fevers, persistent vomiting, altered mental status.

## 2019-03-09 NOTE — ED Notes (Signed)
[facility] bedside 

## 2019-04-07 DEATH — deceased

## 2020-02-22 IMAGING — CT CT HEAD W/O CM
3 series · 14 of 47 positions shown, 16 images · non-contrast
Comparison: MRI head 11/26/2013.

CLINICAL DATA: Unwitnessed fall with forehead laceration. Head
injury.

EXAM:
CT HEAD WITHOUT CONTRAST
CT MAXILLOFACIAL WITHOUT CONTRAST
CT CERVICAL SPINE WITHOUT CONTRAST
TECHNIQUE: Multidetector CT imaging of the head, cervical spine, and
maxillofacial structures were performed using the standard protocol
without intravenous contrast. Multiplanar CT image reconstructions
of the cervical spine and maxillofacial structures were also
generated.

[Series 3: head wo · axial · 0.47mm/px · z∈[-123,+2]mm · 8 of 31 slices shown, 10 images]
[im 3/31  brain]
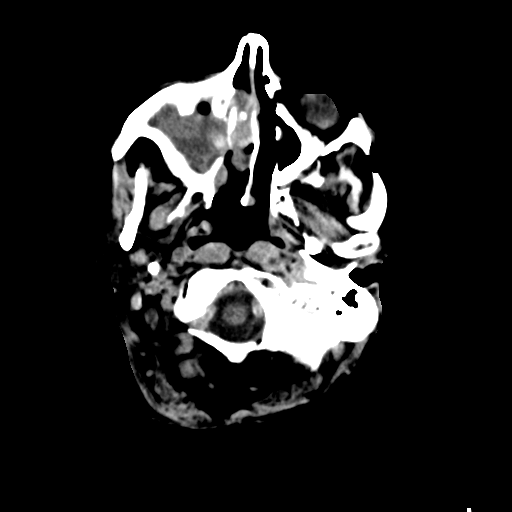
[im 3/31  bone]
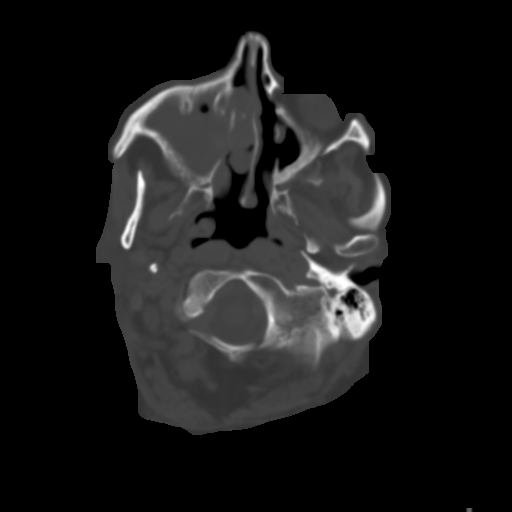
[im 7/31  brain]
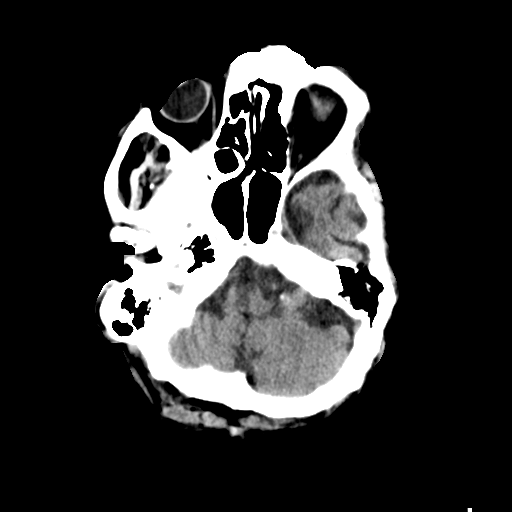
[im 10/31  brain]
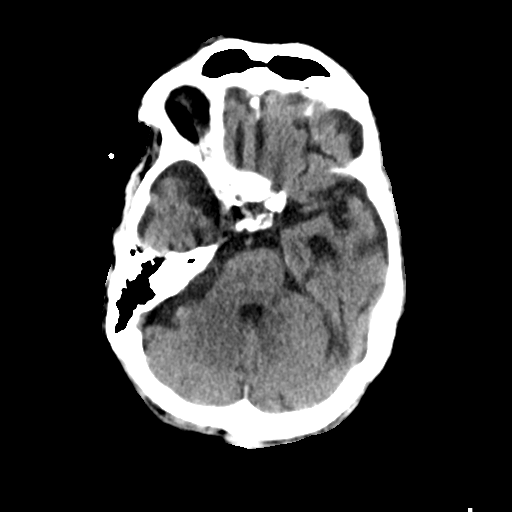
[im 14/31  brain]
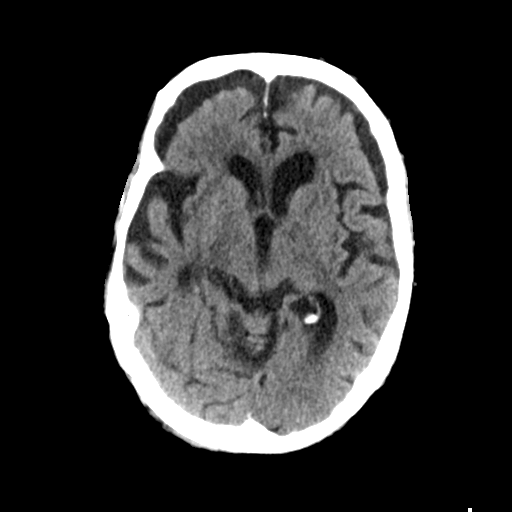
[im 17/31  brain]
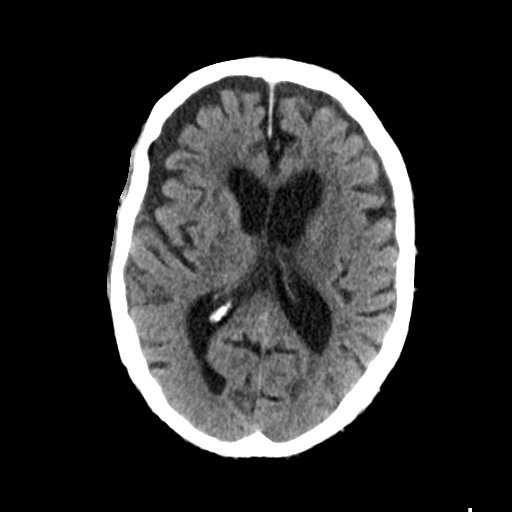
[im 17/31  bone]
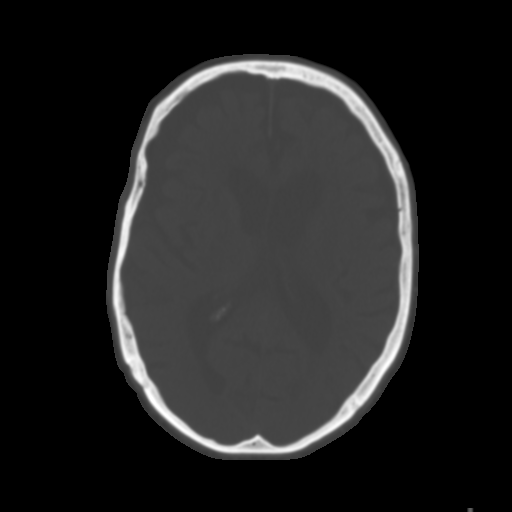
[im 21/31  brain]
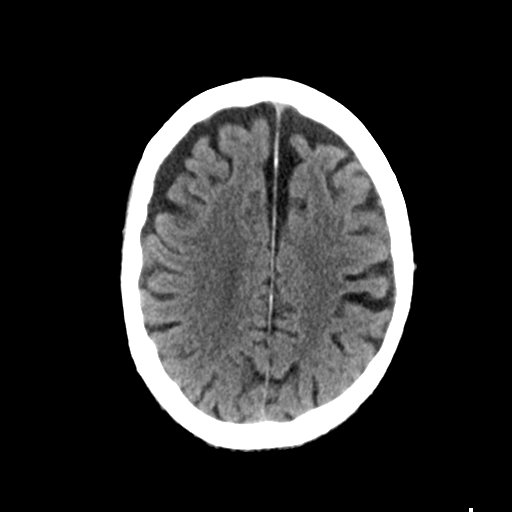
[im 24/31  brain]
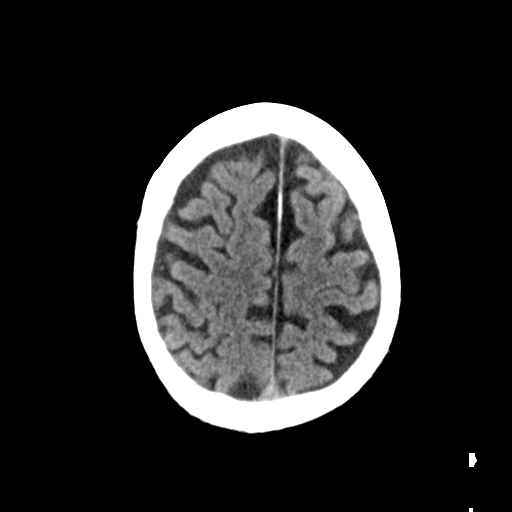
[im 28/31  brain]
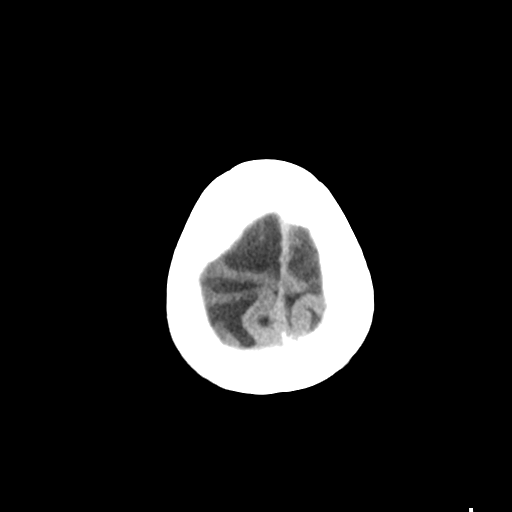

[Series 6: coronal soft tissue · coronal · 0.30mm/px · 3 of 68 slices shown]
[im 23/68  brain]
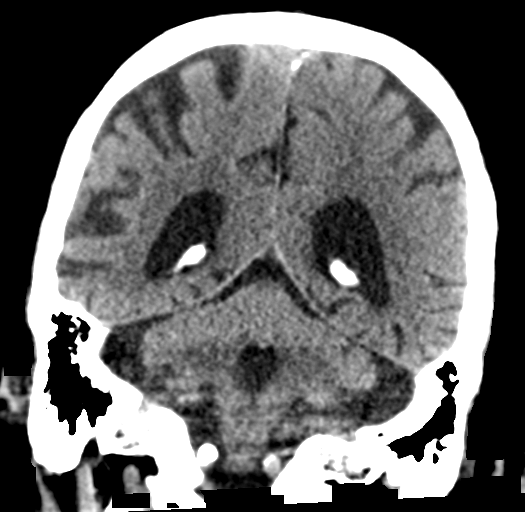
[im 30/68  brain]
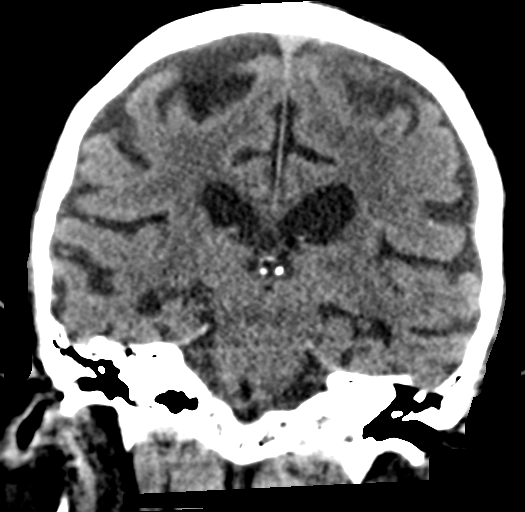
[im 38/68  brain]
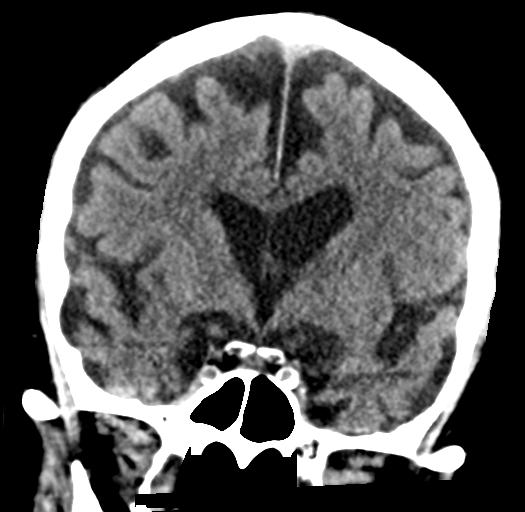

[Series 7: sagittal soft tissue · sagittal · 0.30mm/px · 3 of 53 slices shown]
[im 18/53  brain]
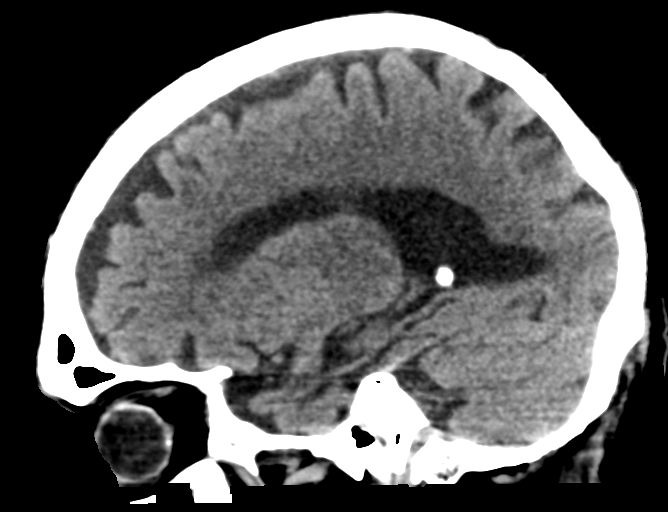
[im 27/53  brain]
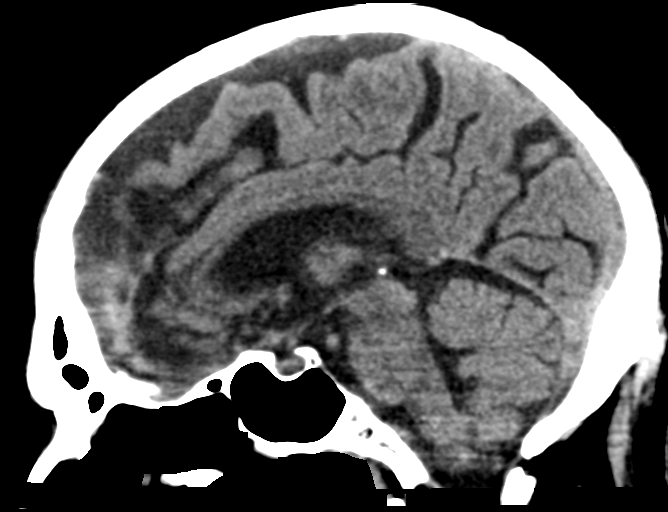
[im 35/53  brain]
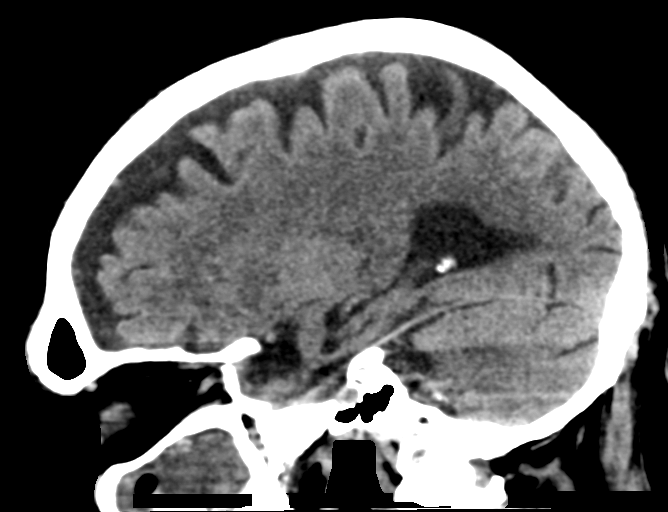

[14 of 47 positions shown; findings below may reference images not displayed]

FINDINGS: CT HEAD FINDINGS

Brain: There is no evidence of acute intracranial hemorrhage, mass
lesion, brain edema or extra-axial fluid collection. There is stable
atrophy with prominence of the ventricles and subarachnoid spaces,
the latter asymmetric to the right and stable. Mild chronic small
vessel ischemic changes in the periventricular white matter are
stable. There is no CT evidence of acute cortical infarction.

Vascular: Intracranial vascular calcifications. No hyperdense vessel
identified.

Skull: Negative for fracture or focal lesion.

Other: None.

CT MAXILLOFACIAL FINDINGS

Osseous: This portion of the study is mildly motion degraded. There
is no evidence of acute fracture or dislocation. There is chronic
mucoperiosteal thickening throughout the paranasal sinuses,
especially in the right maxillary sinus. The temporomandibular
joints are intact.

Orbits: Irregular left ocular calcifications appear chronic, similar
to previous MRI. The right globe is intact. No evidence of orbital
hematoma. The optic nerves and extraocular muscles appear intact.

Sinuses: Diffuse mucoperiosteal thickening throughout the paranasal
sinuses. The right maxillary sinus is chronically opacified. There
is mucosal thickening in the frontal and ethmoid sinuses
bilaterally. The mastoid air cells and middle ears are clear.

Soft tissues: No facial soft tissue hematoma or foreign body. The
mastoid air cells and middle ears are clear.

CT CERVICAL SPINE FINDINGS

Alignment: Normal.

Skull base and vertebrae: No evidence of acute cervical spine
fracture or traumatic subluxation. Probable incomplete segmentation
or ankylosis of the C7-T1 vertebral bodies and facet joints.

Soft tissues and spinal canal: No prevertebral fluid or swelling. No
visible canal hematoma.

Disc levels: There is mild multilevel spondylosis with disc space
narrowing, uncinate spurring and facet hypertrophy. Resulting mild
foraminal narrowing, greatest on the left at C3-4 and C4-5. No large
disc herniation identified.

Upper chest: Bilateral carotid atherosclerosis.

Other: None.
IMPRESSION: 1. No acute intracranial or calvarial findings. Stable atrophy and
chronic small vessel ischemic changes.
2. No evidence of acute facial fracture or orbital hematoma. Stable
findings of chronic sinusitis and chronic left ocular findings,
presumed posttraumatic or postsurgical.
3. No evidence of acute cervical spine fracture, traumatic
subluxation or static signs of instability. Mild spondylosis.

## 2020-02-22 IMAGING — CR DG FOREARM 2V*R*
2 series · 2 of 2 positions shown · non-contrast
Comparison: None.

CLINICAL DATA: Laceration

EXAM:
RIGHT FOREARM - 2 VIEW

[x forearm ap right]
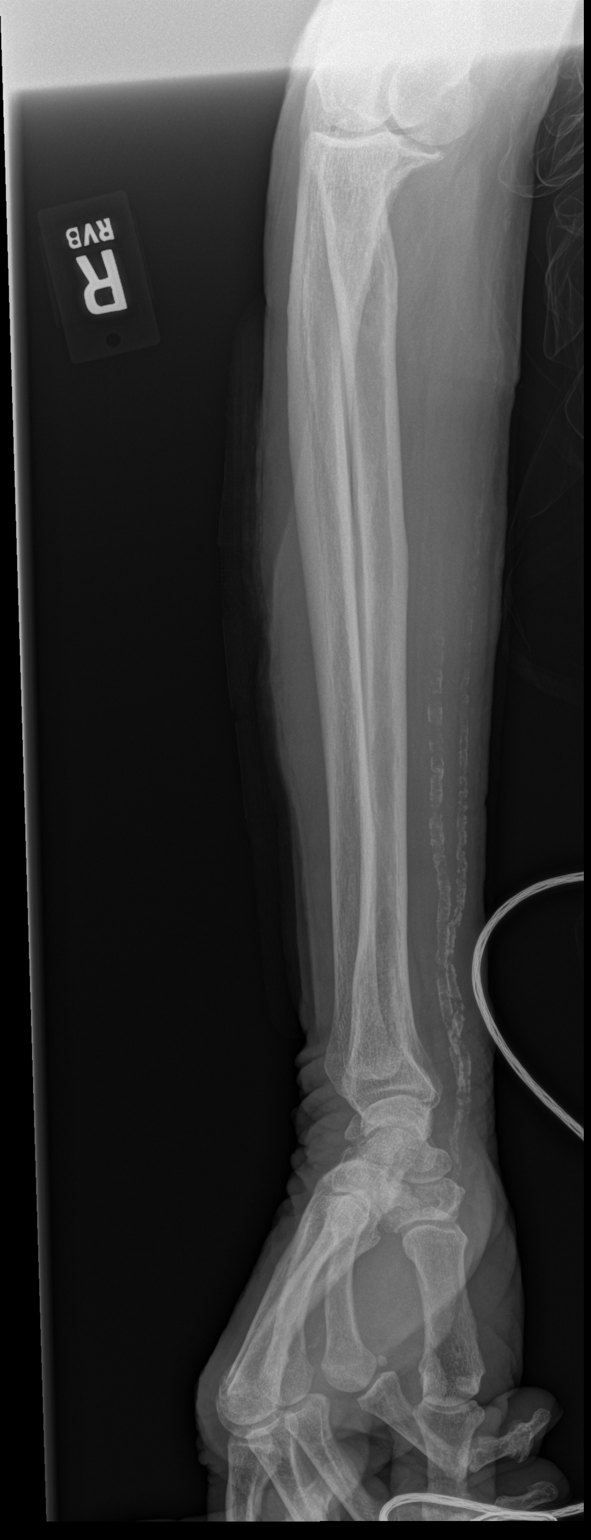

[x forearm lat right]
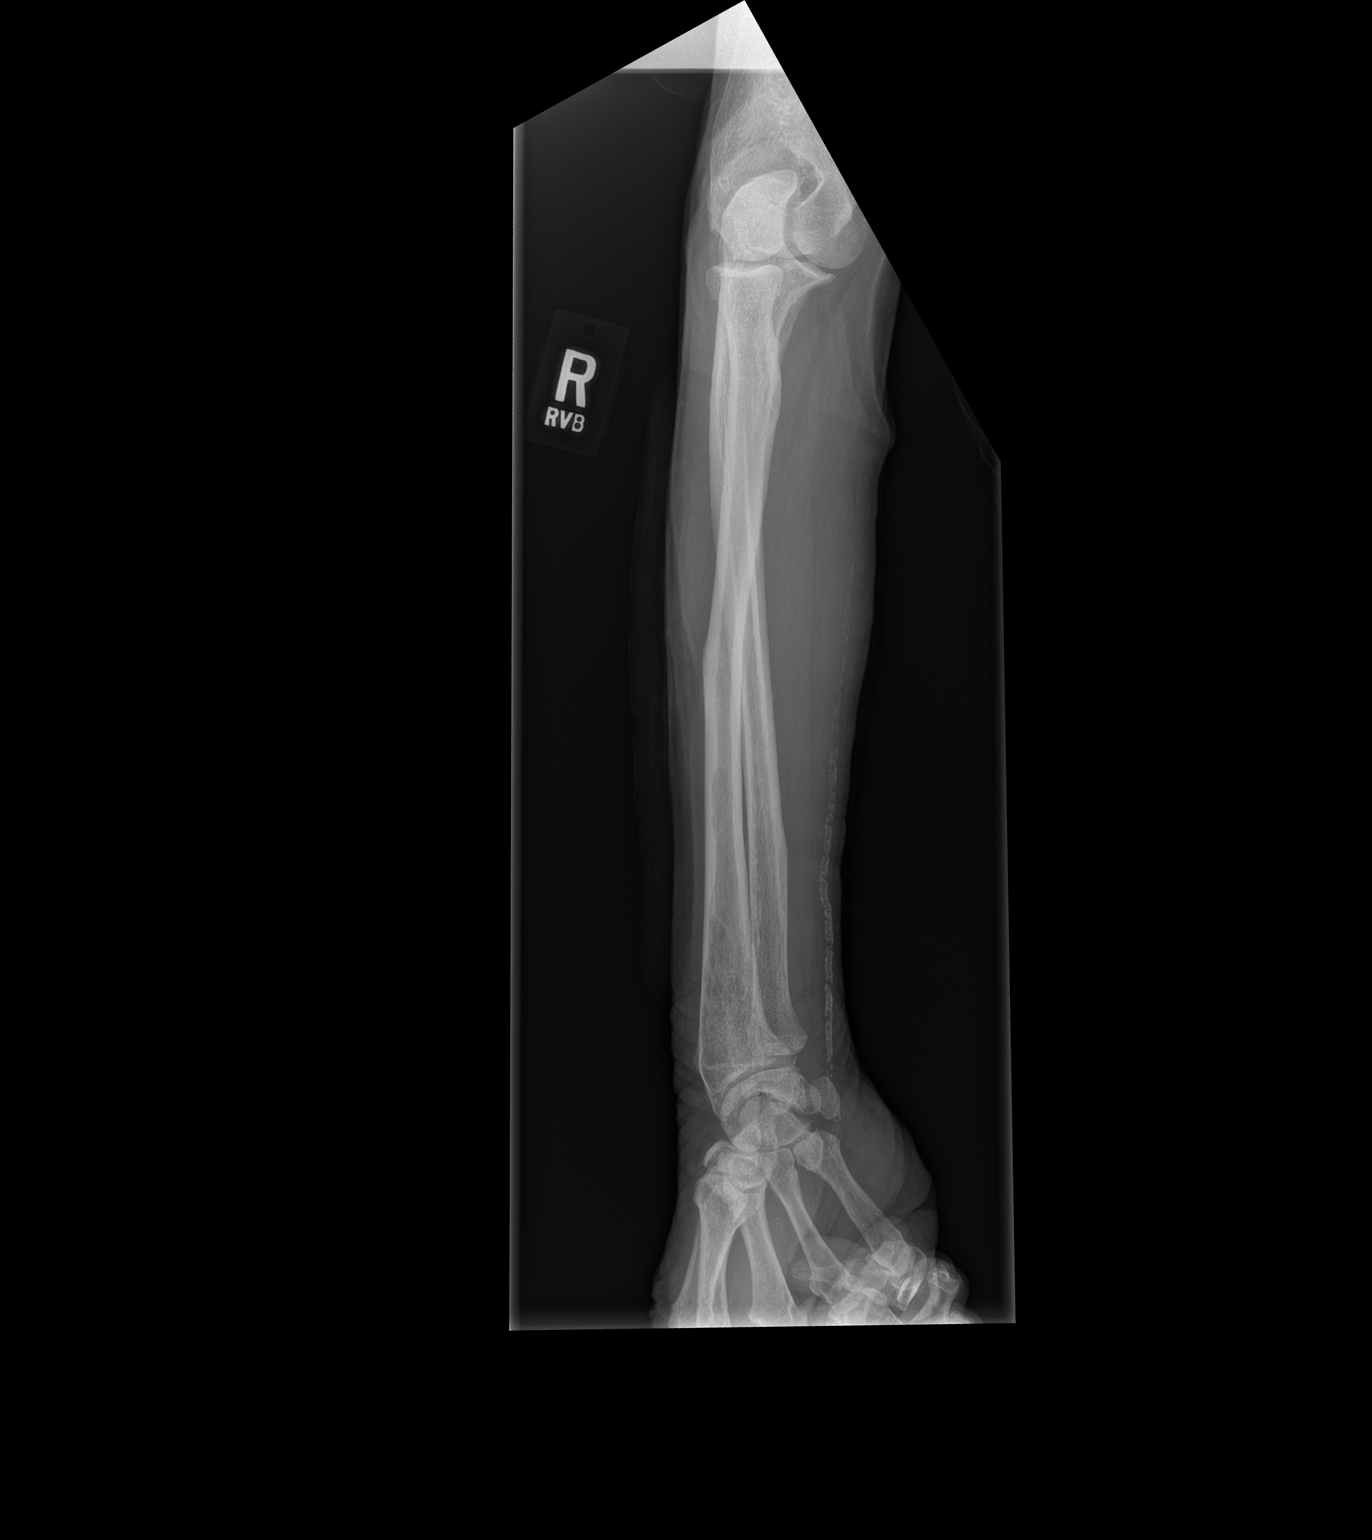

[2 of 2 positions shown; findings below may reference images not displayed]

FINDINGS: No radiopaque foreign body. No acute fracture. Vascular
calcification is noted.
IMPRESSION: No acute osseous abnormality or radiopaque foreign body.
